# Patient Record
Sex: Male | Born: 1995 | Race: White | Hispanic: No | Smoking: Current some day smoker
Health system: Southern US, Community
[De-identification: ages and names within clinical notes are randomized; demographics above are authoritative.]

## PROBLEM LIST (undated history)

## (undated) DIAGNOSIS — Q059 Spina bifida, unspecified: Secondary | ICD-10-CM

## (undated) DIAGNOSIS — J189 Pneumonia, unspecified organism: Secondary | ICD-10-CM

## (undated) DIAGNOSIS — F419 Anxiety disorder, unspecified: Secondary | ICD-10-CM

## (undated) DIAGNOSIS — N159 Renal tubulo-interstitial disease, unspecified: Secondary | ICD-10-CM

## (undated) DIAGNOSIS — I10 Essential (primary) hypertension: Secondary | ICD-10-CM

## (undated) DIAGNOSIS — Z9889 Other specified postprocedural states: Secondary | ICD-10-CM

## (undated) DIAGNOSIS — F32A Depression, unspecified: Secondary | ICD-10-CM

## (undated) DIAGNOSIS — F329 Major depressive disorder, single episode, unspecified: Secondary | ICD-10-CM

## (undated) DIAGNOSIS — E669 Obesity, unspecified: Secondary | ICD-10-CM

## (undated) DIAGNOSIS — G4730 Sleep apnea, unspecified: Secondary | ICD-10-CM

## (undated) DIAGNOSIS — R519 Headache, unspecified: Secondary | ICD-10-CM

## (undated) DIAGNOSIS — R51 Headache: Secondary | ICD-10-CM

## (undated) DIAGNOSIS — R112 Nausea with vomiting, unspecified: Secondary | ICD-10-CM

## (undated) HISTORY — PX: BLADDER SURGERY: SHX569

## (undated) HISTORY — PX: WISDOM TOOTH EXTRACTION: SHX21

## (undated) HISTORY — PX: PORT-A-CATH REMOVAL: SHX5289

## (undated) HISTORY — DX: Obesity, unspecified: E66.9

## (undated) HISTORY — PX: PORT A CATH REVISION: SHX6033

## (undated) HISTORY — DX: Major depressive disorder, single episode, unspecified: F32.9

## (undated) HISTORY — PX: STOMACH SURGERY: SHX791

## (undated) HISTORY — PX: TONSILLECTOMY: SUR1361

## (undated) HISTORY — PX: ADENOIDECTOMY: SHX5191

## (undated) HISTORY — PX: BACK SURGERY: SHX140

## (undated) HISTORY — DX: Depression, unspecified: F32.A

## (undated) HISTORY — PX: FRACTURE SURGERY: SHX138

---
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
## 2015-01-28 DIAGNOSIS — E559 Vitamin D deficiency, unspecified: Secondary | ICD-10-CM | POA: Insufficient documentation

## 2015-05-21 DIAGNOSIS — G4710 Hypersomnia, unspecified: Secondary | ICD-10-CM | POA: Insufficient documentation

## 2015-05-21 DIAGNOSIS — R0683 Snoring: Secondary | ICD-10-CM | POA: Insufficient documentation

## 2016-04-05 DIAGNOSIS — J069 Acute upper respiratory infection, unspecified: Secondary | ICD-10-CM | POA: Diagnosis not present

## 2016-05-13 DIAGNOSIS — N390 Urinary tract infection, site not specified: Secondary | ICD-10-CM | POA: Diagnosis not present

## 2016-05-13 DIAGNOSIS — B372 Candidiasis of skin and nail: Secondary | ICD-10-CM | POA: Diagnosis not present

## 2016-05-13 DIAGNOSIS — I10 Essential (primary) hypertension: Secondary | ICD-10-CM | POA: Diagnosis not present

## 2016-06-27 DIAGNOSIS — K592 Neurogenic bowel, not elsewhere classified: Secondary | ICD-10-CM | POA: Diagnosis not present

## 2016-06-27 DIAGNOSIS — Q059 Spina bifida, unspecified: Secondary | ICD-10-CM | POA: Diagnosis not present

## 2016-06-27 DIAGNOSIS — R319 Hematuria, unspecified: Secondary | ICD-10-CM | POA: Diagnosis not present

## 2016-06-27 DIAGNOSIS — N390 Urinary tract infection, site not specified: Secondary | ICD-10-CM | POA: Diagnosis not present

## 2016-06-27 DIAGNOSIS — N319 Neuromuscular dysfunction of bladder, unspecified: Secondary | ICD-10-CM | POA: Diagnosis not present

## 2017-02-02 DIAGNOSIS — I10 Essential (primary) hypertension: Secondary | ICD-10-CM | POA: Diagnosis not present

## 2017-02-02 DIAGNOSIS — I872 Venous insufficiency (chronic) (peripheral): Secondary | ICD-10-CM | POA: Diagnosis not present

## 2017-04-14 DIAGNOSIS — A084 Viral intestinal infection, unspecified: Secondary | ICD-10-CM | POA: Diagnosis not present

## 2017-04-19 ENCOUNTER — Emergency Department (HOSPITAL_COMMUNITY): Payer: 59

## 2017-04-19 ENCOUNTER — Encounter (HOSPITAL_COMMUNITY): Payer: Self-pay | Admitting: Emergency Medicine

## 2017-04-19 ENCOUNTER — Other Ambulatory Visit: Payer: Self-pay | Admitting: Family Medicine

## 2017-04-19 ENCOUNTER — Ambulatory Visit
Admission: RE | Admit: 2017-04-19 | Discharge: 2017-04-19 | Disposition: A | Discharge status: Home / Self Care | Payer: 59 | Source: Ambulatory Visit | Attending: Family Medicine | Admitting: Family Medicine

## 2017-04-19 ENCOUNTER — Inpatient Hospital Stay (HOSPITAL_COMMUNITY)
Admission: EM | Admit: 2017-04-19 | Discharge: 2017-04-24 | DRG: 580 | Disposition: A | Discharge status: Home / Self Care | Payer: 59 | Attending: Internal Medicine | Admitting: Internal Medicine

## 2017-04-19 DIAGNOSIS — R109 Unspecified abdominal pain: Secondary | ICD-10-CM | POA: Diagnosis not present

## 2017-04-19 DIAGNOSIS — L02211 Cutaneous abscess of abdominal wall: Secondary | ICD-10-CM | POA: Diagnosis not present

## 2017-04-19 DIAGNOSIS — L03311 Cellulitis of abdominal wall: Secondary | ICD-10-CM | POA: Diagnosis not present

## 2017-04-19 DIAGNOSIS — Z885 Allergy status to narcotic agent status: Secondary | ICD-10-CM

## 2017-04-19 DIAGNOSIS — Z888 Allergy status to other drugs, medicaments and biological substances status: Secondary | ICD-10-CM

## 2017-04-19 DIAGNOSIS — Z79899 Other long term (current) drug therapy: Secondary | ICD-10-CM

## 2017-04-19 DIAGNOSIS — Z91040 Latex allergy status: Secondary | ICD-10-CM

## 2017-04-19 DIAGNOSIS — N319 Neuromuscular dysfunction of bladder, unspecified: Secondary | ICD-10-CM | POA: Diagnosis present

## 2017-04-19 DIAGNOSIS — Z8249 Family history of ischemic heart disease and other diseases of the circulatory system: Secondary | ICD-10-CM

## 2017-04-19 DIAGNOSIS — L0390 Cellulitis, unspecified: Secondary | ICD-10-CM

## 2017-04-19 DIAGNOSIS — Q059 Spina bifida, unspecified: Secondary | ICD-10-CM

## 2017-04-19 DIAGNOSIS — Z881 Allergy status to other antibiotic agents status: Secondary | ICD-10-CM

## 2017-04-19 DIAGNOSIS — Z6841 Body Mass Index (BMI) 40.0 and over, adult: Secondary | ICD-10-CM

## 2017-04-19 DIAGNOSIS — B9620 Unspecified Escherichia coli [E. coli] as the cause of diseases classified elsewhere: Secondary | ICD-10-CM | POA: Diagnosis present

## 2017-04-19 DIAGNOSIS — Z91018 Allergy to other foods: Secondary | ICD-10-CM

## 2017-04-19 DIAGNOSIS — Z221 Carrier of other intestinal infectious diseases: Secondary | ICD-10-CM

## 2017-04-19 DIAGNOSIS — D72829 Elevated white blood cell count, unspecified: Secondary | ICD-10-CM

## 2017-04-19 DIAGNOSIS — L03319 Cellulitis of trunk, unspecified: Secondary | ICD-10-CM

## 2017-04-19 DIAGNOSIS — I10 Essential (primary) hypertension: Secondary | ICD-10-CM | POA: Diagnosis present

## 2017-04-19 DIAGNOSIS — L02219 Cutaneous abscess of trunk, unspecified: Secondary | ICD-10-CM | POA: Diagnosis present

## 2017-04-19 DIAGNOSIS — Z1624 Resistance to multiple antibiotics: Secondary | ICD-10-CM | POA: Diagnosis present

## 2017-04-19 DIAGNOSIS — E876 Hypokalemia: Secondary | ICD-10-CM | POA: Diagnosis present

## 2017-04-19 HISTORY — DX: Essential (primary) hypertension: I10

## 2017-04-19 LAB — CBC WITH DIFFERENTIAL/PLATELET
BASOS ABS: 0 10*3/uL (ref 0.0–0.1)
Basophils Relative: 0 %
EOS ABS: 0.1 10*3/uL (ref 0.0–0.7)
Eosinophils Relative: 1 %
HCT: 47 % (ref 39.0–52.0)
HEMOGLOBIN: 15.5 g/dL (ref 13.0–17.0)
LYMPHS ABS: 1.8 10*3/uL (ref 0.7–4.0)
LYMPHS PCT: 13 %
MCH: 27.1 pg (ref 26.0–34.0)
MCHC: 33 g/dL (ref 30.0–36.0)
MCV: 82.3 fL (ref 78.0–100.0)
Monocytes Absolute: 1.5 10*3/uL — ABNORMAL HIGH (ref 0.1–1.0)
Monocytes Relative: 10 %
NEUTROS PCT: 76 %
Neutro Abs: 11.3 10*3/uL — ABNORMAL HIGH (ref 1.7–7.7)
PLATELETS: 293 10*3/uL (ref 150–400)
RBC: 5.71 MIL/uL (ref 4.22–5.81)
RDW: 14.1 % (ref 11.5–15.5)
WBC: 14.6 10*3/uL — AB (ref 4.0–10.5)

## 2017-04-19 LAB — URINALYSIS, ROUTINE W REFLEX MICROSCOPIC
Bilirubin Urine: NEGATIVE
GLUCOSE, UA: NEGATIVE mg/dL
KETONES UR: NEGATIVE mg/dL
Nitrite: NEGATIVE
PH: 6 (ref 5.0–8.0)
Protein, ur: NEGATIVE mg/dL
SPECIFIC GRAVITY, URINE: 1.014 (ref 1.005–1.030)

## 2017-04-19 LAB — COMPREHENSIVE METABOLIC PANEL
ALT: 75 U/L — ABNORMAL HIGH (ref 17–63)
AST: 73 U/L — ABNORMAL HIGH (ref 15–41)
Albumin: 3.8 g/dL (ref 3.5–5.0)
Alkaline Phosphatase: 89 U/L (ref 38–126)
Anion gap: 11 (ref 5–15)
BILIRUBIN TOTAL: 2.3 mg/dL — AB (ref 0.3–1.2)
BUN: 11 mg/dL (ref 6–20)
CHLORIDE: 99 mmol/L — AB (ref 101–111)
CO2: 28 mmol/L (ref 22–32)
Calcium: 9.2 mg/dL (ref 8.9–10.3)
Creatinine, Ser: 1.09 mg/dL (ref 0.61–1.24)
Glucose, Bld: 93 mg/dL (ref 65–99)
POTASSIUM: 3.2 mmol/L — AB (ref 3.5–5.1)
Sodium: 138 mmol/L (ref 135–145)
TOTAL PROTEIN: 8.2 g/dL — AB (ref 6.5–8.1)

## 2017-04-19 LAB — I-STAT CG4 LACTIC ACID, ED: LACTIC ACID, VENOUS: 0.82 mmol/L (ref 0.5–1.9)

## 2017-04-19 MED ORDER — IOPAMIDOL (ISOVUE-300) INJECTION 61%
INTRAVENOUS | Status: AC
  Administered 2017-04-19: 100 mL
  Filled 2017-04-19: qty 100

## 2017-04-19 NOTE — ED Notes (Signed)
Patient transported to CT 

## 2017-04-19 NOTE — ED Triage Notes (Signed)
Patient has port in abd so could flush colon out due to spina bifida that had removed 5 years ago. Patient reports that he was [facility] which showed fluid so told to come to ED for CT scan.  Patient was started on amoxicillin. Patient does have erythema on right lateral abd.

## 2017-04-19 NOTE — ED Notes (Signed)
Failed attempt to collect labs. RN notified °

## 2017-04-19 NOTE — ED Provider Notes (Signed)
[facility]-EMERGENCY DEPT Provider Note   CSN: [id number] Arrival date & time: 04/19/17  1632     History   Chief Complaint Chief Complaint  Patient presents with  . sent for CT scan    HPI Thomas [name] is a 22 y.o. male.  22yo M w/ PMH including spina bifida, HTN, self-catherization who p/w abdominal pain.  6 days ago he began having vomiting and diarrhea that he thought were related to a stomach bug.  The following day he saw his PCP and was given nausea and diarrhea medications.  His symptoms resolved a day later but 4 days ago he began having right lower abdominal pain.  He initially thought that it was related to his recent illness but over the past few days he has noted an area of redness near the site of his previous port and he has had worsening swelling, pain, and warmth.  He was evaluated in clinic today and US showed fluid, was sent here for CT scan. He denies any associated fevers.  He has to self cath daily and has not noted any changes in urination.  He has not had any recent vomiting or diarrhea.  His port was removed several years ago and he has not had any problems since then.   The history is provided by the patient.    Past Medical History:  Diagnosis Date  . Hypertension   . Spina bifida (HCC)     There are no active problems to display for this patient.   Past Surgical History:  Procedure Laterality Date  . ADENOIDECTOMY    . BACK SURGERY    . BLADDER SURGERY    . FOOT SURGERY    . FRACTURE SURGERY    . PORT-A-CATH REMOVAL    . STOMACH SURGERY    . TONSILLECTOMY         Home Medications    Prior to Admission medications   Medication Sig Start Date End Date Taking? Authorizing Provider  atenolol (TENORMIN) 100 MG tablet Take 100 mg by mouth daily. 02/02/17  Yes [provider]  hydrochlorothiazide (HYDRODIURIL) 25 MG tablet Take 25 mg by mouth every morning. 02/02/17  Yes [provider]  ibuprofen  (ADVIL,MOTRIN) 200 MG tablet Take 400 mg by mouth every 6 (six) hours as needed. Patient used this medication for his headache.   Yes [provider]    Family History Family History  Problem Relation Age of Onset  . Asthma Other   . Cancer Other   . COPD Other   . Hyperlipidemia Other   . Hypertension Other   . Stroke Other     Social History Social History   Tobacco Use  . Smoking status: Never Smoker  . Smokeless tobacco: Never Used  Substance Use Topics  . Alcohol use: No  . Drug use: No     Allergies   Codeine; Latex; Lorazepam; Sulfa antibiotics; Vancomycin; Food; and Ciprofloxacin   Review of Systems Review of Systems All other systems reviewed and are negative except that which was mentioned in HPI   Physical Exam Updated Vital Signs BP (!) 159/102 (BP Location: Right Arm)   Pulse 82   Temp 98.9 F (37.2 C) (Oral)   Resp 20   SpO2 99%   Physical Exam  Constitutional: He is oriented to person, place, and time. He appears well-developed and well-nourished. No distress.  HENT:  Head: Normocephalic and atraumatic.  Moist mucous membranes  Eyes: Conjunctivae are  normal.  Neck: Neck supple.  Cardiovascular: Normal rate, regular rhythm and normal heart sounds.  No murmur heard. Pulmonary/Chest: Effort normal and breath sounds normal.  Abdominal: Soft. Bowel sounds are normal. He exhibits no distension. There is tenderness. There is no rebound and no guarding.  Scar on R lower abdomen that is erythematous, fluctuant, and tender to palpation with large surrounding area of erythema and induration; no other areas of tenderness outside erythema  Musculoskeletal: He exhibits no edema.  Neurological: He is alert and oriented to person, place, and time.  Fluent speech  Skin: Skin is warm and dry.  Psychiatric: He has a normal mood and affect. Judgment normal.  Nursing note and vitals reviewed.    ED Treatments / Results  Labs (all labs ordered are  listed, but only abnormal results are displayed) Labs Reviewed  COMPREHENSIVE METABOLIC PANEL - Abnormal; Notable for the following components:      Result Value   Potassium 3.2 (*)    Chloride 99 (*)    Total Protein 8.2 (*)    AST 73 (*)    ALT 75 (*)    Total Bilirubin 2.3 (*)    All other components within normal limits  CBC WITH DIFFERENTIAL/PLATELET - Abnormal; Notable for the following components:   WBC 14.6 (*)    Neutro Abs 11.3 (*)    Monocytes Absolute 1.5 (*)    All other components within normal limits  URINALYSIS, ROUTINE W REFLEX MICROSCOPIC - Abnormal; Notable for the following components:   Color, Urine AMBER (*)    APPearance HAZY (*)    Hgb urine dipstick SMALL (*)    Leukocytes, UA MODERATE (*)    Bacteria, UA RARE (*)    Squamous Epithelial / LPF 0-5 (*)    All other components within normal limits  I-STAT CG4 LACTIC ACID, ED    EKG  EKG Interpretation None       Radiology Ct Abdomen Pelvis W Contrast  Result Date: 04/19/2017 CLINICAL DATA:  22 y/o M; right lateral abdomen abdominal pain, question abscess. EXAM: CT ABDOMEN AND PELVIS WITH CONTRAST TECHNIQUE: Multidetector CT imaging of the abdomen and pelvis was performed using the standard protocol following bolus administration of intravenous contrast. CONTRAST:  ISOVUE-300 IOPAMIDOL (ISOVUE-300) INJECTION 61% COMPARISON:  04/19/2017 abdominal ultrasound. FINDINGS: Lower chest: No acute abnormality. Hepatobiliary: Hepatic steatosis. No focal liver lesion identified. Increased attenuation within the dependent gallbladder probably represents sludge and/or small stones. No biliary ductal dilatation. Pancreas: Unremarkable. No pancreatic ductal dilatation or surrounding inflammatory changes. Spleen: Normal in size without focal abnormality. Adrenals/Urinary Tract: Atrophic right kidney. Normal appearance of left kidney. Normal adrenal glands. No hydronephrosis. Redundant bladder with mild wall thickening.  Stomach/Bowel: Within the right lower anterior abdominal wall there is an air-filled cavity extending to the abdominal wall musculature. At the deep end of the cavity there is a fluid collection measuring 3.5 cm that extends to the abdominal wall musculature (series 5 image 46). Within the peritoneal cavity a loop of colon is tethered to the abdominal wall musculature and may be connected by fistula to the overlying fluid collection. No obstructive or inflammatory changes of the bowel identified. Right lower quadrant bowel anastomosis without inflammatory or proximal obstruction. Vascular/Lymphatic: No significant vascular findings are present. No enlarged abdominal or pelvic lymph nodes. Reproductive: Prostate is unremarkable. Other: No ascites. Musculoskeletal: Complex lumbar and sacral dysraphism. No acute osseous abnormality identified. IMPRESSION: 1. Air and fluid-filled collection with right lower anterior abdominal wall subcutaneous  fat likely connected to a subjacent tethered loop of colon by fistula. 2. Hepatic steatosis. 3. Gallbladder sludge and/or small stones. 4. Atrophic right kidney. 5. Redundant bladder with mild wall thickening, question neurogenic bladder. 6. Complex lumbar and sacral dysraphism. Electronically Signed   By: Mitzi Hansen M.D.   On: 04/19/2017 22:15   US Abdomen Limited  Result Date: 04/19/2017 CLINICAL DATA:  Pain at site of prior port, some redness EXAM: ULTRASOUND ABDOMEN LIMITED COMPARISON:  None. FINDINGS: Ultrasound over the area in question was performed. There is some fluid within the subcutaneous soft tissues extending toward the peritoneal cavity. However much of this area is obscured by bowel gas and cannot be evaluated well by ultrasound. Therefore CT of the abdomen pelvis is recommended to assess for possible abscess which may communicate with the peritoneal cavity. IMPRESSION: There is fluid within the subcutaneous soft tissues extending deeper possibly  into the peritoneal cavity. Recommend CT of the abdomen pelvis as noted above. Electronically Signed   By: Dwyane Dee M.D.   On: 04/19/2017 12:27    Procedures Procedures (including critical care time)  Medications Ordered in ED Medications  iopamidol (ISOVUE-300) 61 % injection (100 mLs  Contrast Given 04/19/17 2144)     Initial Impression / Assessment and Plan / ED Course  I have reviewed the triage vital signs and the nursing notes.  Pertinent labs & imaging results that were available during my care of the patient were reviewed by me and considered in my medical decision making (see chart for details).     PT w/ abdominal wall cellulitis on exam ,concern for deep space fluid collection in abdominal wall. Labs show K 3.2, Cr 1.09, AST 73, ALT 75, tbili 2.3, WBC 14.6. UA with WBC and leukocytes which is expected given pt's bladder history. He denies any urinary sx and therefore I do not feel he needs treatment but I added urine culture.   CT shows air and fluid-filled collection in right lower abdominal wall subcutaneous fat looks connected to a tethered loop of colon by a fistula.  I contacted general surgery, discussed with Dr. Maisie Fus.  She will evaluate the patient in ED. I am signing pt out to Dr. Nicanor Alcon. I anticipate admission for further treatment.   Final Clinical Impressions(s) / ED Diagnoses   Final diagnoses:  None    ED Discharge Orders    None       Little, Ambrose Finland, MD 04/20/17 0009

## 2017-04-19 NOTE — Consult Note (Signed)
Reason for Consult:Abdominal wall infection Referring Physician: Dr Venancio Poisson Gladd is an 22 y.o. male.  HPI: Pt with spina bifida.  Had what sounds like a MACE procedure at [facility] as a child to help with constipation.  He had this removed a few years ago since he did not need it any more with his current bowel regimen.  He has had no problems since then.  He developed cellulitis earlier this week.  He was seen by his PCP and sent to ED for [place] to eval for abscess.  He has had a GI bug recently.  Past Medical History:  Diagnosis Date  . Hypertension   . Spina bifida [facility])     Past Surgical History:  Procedure Laterality Date  . ADENOIDECTOMY    . BACK SURGERY    . BLADDER SURGERY    . FOOT SURGERY    . FRACTURE SURGERY    . PORT-A-CATH REMOVAL    . STOMACH SURGERY    . TONSILLECTOMY      Family History  Problem Relation Age of Onset  . Asthma Other   . Cancer Other   . COPD Other   . Hyperlipidemia Other   . Hypertension Other   . Stroke Other     Social History:  reports that  has never smoked. he has never used smokeless tobacco. He reports that he does not drink alcohol or use drugs.  Allergies:  Allergies  Allergen Reactions  . Codeine Rash  . Latex Rash and Swelling    Other reaction(s): Other (See Comments) Other Reaction: mouth swells   . Lorazepam Other (See Comments)    Hallucinations Other reaction(s): Hallucinations, Other (See Comments) Other Reaction: Hallucinations   . Sulfa Antibiotics Rash  . Vancomycin     Other reaction(s): Other (See Comments) Other Reaction: Redman's syndrome  . Food     Tropical fruit  . Ciprofloxacin Nausea Only    Other reaction(s): Other (See Comments), Vomiting chills    Medications: I have reviewed the patient's current medications.  Results for orders placed or performed during the hospital encounter of 04/19/17 (from the past 48 hour(s))  Comprehensive metabolic panel     Status: Abnormal   Collection  Time: 04/19/17  6:36 PM  Result Value Ref Range   Sodium 138 135 - 145 mmol/L   Potassium 3.2 (L) 3.5 - 5.1 mmol/L   Chloride 99 (L) 101 - 111 mmol/L   CO2 28 22 - 32 mmol/L   Glucose, Bld 93 65 - 99 mg/dL   BUN 11 6 - 20 mg/dL   Creatinine, Ser 1.09 0.61 - 1.24 mg/dL   Calcium 9.2 8.9 - 10.3 mg/dL   Total Protein 8.2 (H) 6.5 - 8.1 g/dL   Albumin 3.8 3.5 - 5.0 g/dL   AST 73 (H) 15 - 41 U/L   ALT 75 (H) 17 - 63 U/L   Alkaline Phosphatase 89 38 - 126 U/L   Total Bilirubin 2.3 (H) 0.3 - 1.2 mg/dL   GFR calc non Af Amer >60 >60 mL/min   GFR calc Af Amer >60 >60 mL/min    Comment: (NOTE) The eGFR has been calculated using the CKD EPI equation. This calculation has not been validated in all clinical situations. eGFR's persistently <60 mL/min signify possible Chronic Kidney Disease.    Anion gap 11 5 - 15  CBC with Differential     Status: Abnormal   Collection Time: 04/19/17  6:36 PM  Result Value  Ref Range   WBC 14.6 (H) 4.0 - 10.5 K/uL   RBC 5.71 4.22 - 5.81 MIL/uL   Hemoglobin 15.5 13.0 - 17.0 g/dL   HCT 47.0 39.0 - 52.0 %   MCV 82.3 78.0 - 100.0 fL   MCH 27.1 26.0 - 34.0 pg   MCHC 33.0 30.0 - 36.0 g/dL   RDW 14.1 11.5 - 15.5 %   Platelets 293 150 - 400 K/uL   Neutrophils Relative % 76 %   Neutro Abs 11.3 (H) 1.7 - 7.7 K/uL   Lymphocytes Relative 13 %   Lymphs Abs 1.8 0.7 - 4.0 K/uL   Monocytes Relative 10 %   Monocytes Absolute 1.5 (H) 0.1 - 1.0 K/uL   Eosinophils Relative 1 %   Eosinophils Absolute 0.1 0.0 - 0.7 K/uL   Basophils Relative 0 %   Basophils Absolute 0.0 0.0 - 0.1 K/uL  I-Stat CG4 Lactic Acid, ED     Status: None   Collection Time: 04/19/17  6:44 PM  Result Value Ref Range   Lactic Acid, Venous 0.82 0.5 - 1.9 mmol/L  Urinalysis, Routine w reflex microscopic     Status: Abnormal   Collection Time: 04/19/17  6:51 PM  Result Value Ref Range   Color, Urine AMBER (A) YELLOW    Comment: BIOCHEMICALS MAY BE AFFECTED BY COLOR   APPearance HAZY (A) CLEAR    Specific Gravity, Urine 1.014 1.005 - 1.030   pH 6.0 5.0 - 8.0   Glucose, UA NEGATIVE NEGATIVE mg/dL   Hgb urine dipstick SMALL (A) NEGATIVE   Bilirubin Urine NEGATIVE NEGATIVE   Ketones, ur NEGATIVE NEGATIVE mg/dL   Protein, ur NEGATIVE NEGATIVE mg/dL   Nitrite NEGATIVE NEGATIVE   Leukocytes, UA MODERATE (A) NEGATIVE   RBC / HPF 6-30 0 - 5 RBC/hpf   WBC, UA TOO NUMEROUS TO COUNT 0 - 5 WBC/hpf   Bacteria, UA RARE (A) NONE SEEN   Squamous Epithelial / LPF 0-5 (A) NONE SEEN   WBC Clumps PRESENT    Mucus PRESENT     Ct Abdomen Pelvis W Contrast  Result Date: 04/19/2017 CLINICAL DATA:  22 y/o M; right lateral abdomen abdominal pain, question abscess. EXAM: CT ABDOMEN AND PELVIS WITH CONTRAST TECHNIQUE: Multidetector CT imaging of the abdomen and pelvis was performed using the standard protocol following bolus administration of intravenous contrast. CONTRAST:  166m ISOVUE-300 IOPAMIDOL (ISOVUE-300) INJECTION 61% COMPARISON:  04/19/2017 abdominal ultrasound. FINDINGS: Lower chest: No acute abnormality. Hepatobiliary: Hepatic steatosis. No focal liver lesion identified. Increased attenuation within the dependent gallbladder probably represents sludge and/or small stones. No biliary ductal dilatation. Pancreas: Unremarkable. No pancreatic ductal dilatation or surrounding inflammatory changes. Spleen: Normal in size without focal abnormality. Adrenals/Urinary Tract: Atrophic right kidney. Normal appearance of left kidney. Normal adrenal glands. No hydronephrosis. Redundant bladder with mild wall thickening. Stomach/Bowel: Within the right lower anterior abdominal wall there is an air-filled cavity extending to the abdominal wall musculature. At the deep end of the cavity there is a fluid collection measuring 3.5 cm that extends to the abdominal wall musculature (series 5 image 46). Within the peritoneal cavity a loop of colon is tethered to the abdominal wall musculature and may be connected by fistula to  the overlying fluid collection. No obstructive or inflammatory changes of the bowel identified. Right lower quadrant bowel anastomosis without inflammatory or proximal obstruction. Vascular/Lymphatic: No significant vascular findings are present. No enlarged abdominal or pelvic lymph nodes. Reproductive: Prostate is unremarkable. Other: No ascites. Musculoskeletal: Complex lumbar  and sacral dysraphism. No acute osseous abnormality identified. IMPRESSION: 1. Air and fluid-filled collection with right lower anterior abdominal wall subcutaneous fat likely connected to a subjacent tethered loop of colon by fistula. 2. Hepatic steatosis. 3. Gallbladder sludge and/or small stones. 4. Atrophic right kidney. 5. Redundant bladder with mild wall thickening, question neurogenic bladder. 6. Complex lumbar and sacral dysraphism. Electronically Signed   By: Kristine Garbe M.D.   On: 04/19/2017 22:15   US Abdomen Limited  Result Date: 04/19/2017 CLINICAL DATA:  Pain at site of prior port, some redness EXAM: ULTRASOUND ABDOMEN LIMITED COMPARISON:  None. FINDINGS: Ultrasound over the area in question was performed. There is some fluid within the subcutaneous soft tissues extending toward the peritoneal cavity. However much of this area is obscured by bowel gas and cannot be evaluated well by ultrasound. Therefore CT of the abdomen pelvis is recommended to assess for possible abscess which may communicate with the peritoneal cavity. IMPRESSION: There is fluid within the subcutaneous soft tissues extending deeper possibly into the peritoneal cavity. Recommend CT of the abdomen pelvis as noted above. Electronically Signed   By: Ivar Drape M.D.   On: 04/19/2017 12:27    Review of Systems  Constitutional: Negative for chills and fever.  Eyes: Negative for blurred vision and double vision.  Cardiovascular: Negative for chest pain and palpitations.  Gastrointestinal: Positive for diarrhea. Negative for nausea and  vomiting.  Genitourinary: Negative for dysuria and urgency.  Skin: Negative for itching.  Neurological: Negative for dizziness and headaches.   Blood pressure (!) 159/102, pulse 82, temperature 98.9 F (37.2 C), temperature source Oral, resp. rate 20, SpO2 99 %. Physical Exam  Constitutional: He is oriented to person, place, and time. He appears well-developed and well-nourished.  HENT:  Head: Normocephalic and atraumatic.  Eyes: Conjunctivae and EOM are normal. Pupils are equal, round, and reactive to light.  Neck: Normal range of motion. Neck supple.  Cardiovascular: Normal rate and regular rhythm.  Respiratory: Effort normal. No respiratory distress.  GI: Soft. He exhibits no distension. There is tenderness (RLQ with associated cellulitis).  Musculoskeletal: Normal range of motion.  Neurological: He is alert and oriented to person, place, and time.    Assessment/Plan: 22 y.o. M with abdominal wall abscess. 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  Recommend IV abx for cellulitis.  NPO p MN.  Will plan on I&D tomorrow.  Could be done at bedside or in OR depending on pt preference and operating surgeon.    Kenidy Crossland C. 3/36/1224, 49:75 PM

## 2017-04-20 ENCOUNTER — Other Ambulatory Visit: Payer: Self-pay

## 2017-04-20 ENCOUNTER — Encounter (HOSPITAL_COMMUNITY): Payer: Self-pay

## 2017-04-20 DIAGNOSIS — I10 Essential (primary) hypertension: Secondary | ICD-10-CM | POA: Diagnosis not present

## 2017-04-20 DIAGNOSIS — L03319 Cellulitis of trunk, unspecified: Secondary | ICD-10-CM | POA: Diagnosis not present

## 2017-04-20 DIAGNOSIS — E876 Hypokalemia: Secondary | ICD-10-CM | POA: Diagnosis not present

## 2017-04-20 DIAGNOSIS — L02211 Cutaneous abscess of abdominal wall: Secondary | ICD-10-CM | POA: Diagnosis not present

## 2017-04-20 DIAGNOSIS — L03311 Cellulitis of abdominal wall: Secondary | ICD-10-CM | POA: Diagnosis not present

## 2017-04-20 DIAGNOSIS — R109 Unspecified abdominal pain: Secondary | ICD-10-CM | POA: Diagnosis not present

## 2017-04-20 DIAGNOSIS — N319 Neuromuscular dysfunction of bladder, unspecified: Secondary | ICD-10-CM | POA: Diagnosis not present

## 2017-04-20 DIAGNOSIS — Z888 Allergy status to other drugs, medicaments and biological substances status: Secondary | ICD-10-CM | POA: Diagnosis not present

## 2017-04-20 DIAGNOSIS — Z881 Allergy status to other antibiotic agents status: Secondary | ICD-10-CM | POA: Diagnosis not present

## 2017-04-20 DIAGNOSIS — Z91040 Latex allergy status: Secondary | ICD-10-CM | POA: Diagnosis not present

## 2017-04-20 DIAGNOSIS — D72829 Elevated white blood cell count, unspecified: Secondary | ICD-10-CM

## 2017-04-20 DIAGNOSIS — Z8249 Family history of ischemic heart disease and other diseases of the circulatory system: Secondary | ICD-10-CM | POA: Diagnosis not present

## 2017-04-20 DIAGNOSIS — Z885 Allergy status to narcotic agent status: Secondary | ICD-10-CM | POA: Diagnosis not present

## 2017-04-20 DIAGNOSIS — Z79899 Other long term (current) drug therapy: Secondary | ICD-10-CM | POA: Diagnosis not present

## 2017-04-20 DIAGNOSIS — Z91018 Allergy to other foods: Secondary | ICD-10-CM | POA: Diagnosis not present

## 2017-04-20 DIAGNOSIS — Z6841 Body Mass Index (BMI) 40.0 and over, adult: Secondary | ICD-10-CM | POA: Diagnosis not present

## 2017-04-20 DIAGNOSIS — Z1624 Resistance to multiple antibiotics: Secondary | ICD-10-CM | POA: Diagnosis present

## 2017-04-20 DIAGNOSIS — Q059 Spina bifida, unspecified: Secondary | ICD-10-CM | POA: Diagnosis not present

## 2017-04-20 DIAGNOSIS — L02219 Cutaneous abscess of trunk, unspecified: Secondary | ICD-10-CM | POA: Diagnosis not present

## 2017-04-20 DIAGNOSIS — B9620 Unspecified Escherichia coli [E. coli] as the cause of diseases classified elsewhere: Secondary | ICD-10-CM | POA: Diagnosis present

## 2017-04-20 LAB — CBC
HCT: 44.2 % (ref 39.0–52.0)
Hemoglobin: 14.5 g/dL (ref 13.0–17.0)
MCH: 27.2 pg (ref 26.0–34.0)
MCHC: 32.8 g/dL (ref 30.0–36.0)
MCV: 82.8 fL (ref 78.0–100.0)
PLATELETS: 304 10*3/uL (ref 150–400)
RBC: 5.34 MIL/uL (ref 4.22–5.81)
RDW: 14.5 % (ref 11.5–15.5)
WBC: 15.2 10*3/uL — ABNORMAL HIGH (ref 4.0–10.5)

## 2017-04-20 LAB — BASIC METABOLIC PANEL
Anion gap: 11 (ref 5–15)
BUN: 10 mg/dL (ref 6–20)
CO2: 25 mmol/L (ref 22–32)
CREATININE: 0.9 mg/dL (ref 0.61–1.24)
Calcium: 8.7 mg/dL — ABNORMAL LOW (ref 8.9–10.3)
Chloride: 101 mmol/L (ref 101–111)
Glucose, Bld: 102 mg/dL — ABNORMAL HIGH (ref 65–99)
Potassium: 3.1 mmol/L — ABNORMAL LOW (ref 3.5–5.1)
SODIUM: 137 mmol/L (ref 135–145)

## 2017-04-20 LAB — PROTIME-INR
INR: 1.09
PROTHROMBIN TIME: 14 s (ref 11.4–15.2)

## 2017-04-20 LAB — APTT: aPTT: 23 seconds — ABNORMAL LOW (ref 24–36)

## 2017-04-20 MED ORDER — ENOXAPARIN SODIUM 40 MG/0.4ML ~~LOC~~ SOLN
40.0000 mg | SUBCUTANEOUS | Status: DC
  Administered 2017-04-21 – 2017-04-22 (×2): 40 mg via SUBCUTANEOUS
  Filled 2017-04-20 (×2): qty 0.4

## 2017-04-20 MED ORDER — ONDANSETRON HCL 4 MG/2ML IJ SOLN: 4.0000 mg | Freq: Four times a day (QID) | INTRAMUSCULAR | Status: DC | PRN

## 2017-04-20 MED ORDER — ATENOLOL 50 MG PO TABS
100.0000 mg | ORAL_TABLET | Freq: Every day | ORAL | Status: DC
  Administered 2017-04-20 – 2017-04-24 (×4): 100 mg via ORAL
  Filled 2017-04-20 (×5): qty 2

## 2017-04-20 MED ORDER — HYDROCHLOROTHIAZIDE 25 MG PO TABS
25.0000 mg | ORAL_TABLET | Freq: Every morning | ORAL | Status: DC
  Administered 2017-04-20 – 2017-04-24 (×5): 25 mg via ORAL
  Filled 2017-04-20 (×5): qty 1

## 2017-04-20 MED ORDER — CEFAZOLIN SODIUM-DEXTROSE 2-4 GM/100ML-% IV SOLN
2.0000 g | Freq: Three times a day (TID) | INTRAVENOUS | Status: DC
  Administered 2017-04-20 – 2017-04-21 (×5): 2 g via INTRAVENOUS
  Filled 2017-04-20 (×6): qty 100

## 2017-04-20 MED ORDER — ACETAMINOPHEN 325 MG PO TABS: 650.0000 mg | ORAL_TABLET | Freq: Four times a day (QID) | ORAL | Status: DC | PRN

## 2017-04-20 MED ORDER — ONDANSETRON HCL 4 MG PO TABS: 4.0000 mg | ORAL_TABLET | Freq: Four times a day (QID) | ORAL | Status: DC | PRN

## 2017-04-20 MED ORDER — LIDOCAINE-EPINEPHRINE 1 %-1:100000 IJ SOLN
30.0000 mL | Freq: Once | INTRAMUSCULAR | Status: AC
  Administered 2017-04-20: 30 mL
  Filled 2017-04-20: qty 30

## 2017-04-20 MED ORDER — ACETAMINOPHEN 650 MG RE SUPP: 650.0000 mg | Freq: Four times a day (QID) | RECTAL | Status: DC | PRN

## 2017-04-20 MED ORDER — FENTANYL CITRATE (PF) 100 MCG/2ML IJ SOLN
25.0000 ug | INTRAMUSCULAR | Status: DC | PRN
  Administered 2017-04-20 (×2): 25 ug via INTRAVENOUS
  Filled 2017-04-20 (×3): qty 2

## 2017-04-20 MED ORDER — FENTANYL CITRATE (PF) 100 MCG/2ML IJ SOLN
25.0000 ug | Freq: Once | INTRAMUSCULAR | Status: AC
  Administered 2017-04-20: 23:00:00 25 ug via INTRAVENOUS

## 2017-04-20 MED ORDER — SODIUM CHLORIDE 0.9 % IV SOLN
INTRAVENOUS | Status: DC
  Administered 2017-04-20 – 2017-04-21 (×4): via INTRAVENOUS

## 2017-04-20 MED ORDER — POTASSIUM CHLORIDE 10 MEQ/100ML IV SOLN
10.0000 meq | INTRAVENOUS | Status: AC
  Administered 2017-04-20 (×2): 10 meq via INTRAVENOUS
  Filled 2017-04-20 (×2): qty 100

## 2017-04-20 NOTE — Progress Notes (Signed)
Patient approved of mother and aunt to be in room during admission process questioning
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
## 2017-04-21 DIAGNOSIS — L03319 Cellulitis of trunk, unspecified: Secondary | ICD-10-CM

## 2017-04-21 DIAGNOSIS — E876 Hypokalemia: Secondary | ICD-10-CM

## 2017-04-21 DIAGNOSIS — I10 Essential (primary) hypertension: Secondary | ICD-10-CM

## 2017-04-21 DIAGNOSIS — L02219 Cutaneous abscess of trunk, unspecified: Secondary | ICD-10-CM

## 2017-04-21 LAB — HEPATIC FUNCTION PANEL
ALK PHOS: 71 U/L (ref 38–126)
ALT: 58 U/L (ref 17–63)
AST: 39 U/L (ref 15–41)
Albumin: 3.1 g/dL — ABNORMAL LOW (ref 3.5–5.0)
BILIRUBIN DIRECT: 0.5 mg/dL (ref 0.1–0.5)
BILIRUBIN TOTAL: 1.5 mg/dL — AB (ref 0.3–1.2)
Indirect Bilirubin: 1 mg/dL — ABNORMAL HIGH (ref 0.3–0.9)
Total Protein: 6.8 g/dL (ref 6.5–8.1)

## 2017-04-21 LAB — URINE CULTURE
CULTURE: NO GROWTH
SPECIAL REQUESTS: NORMAL

## 2017-04-21 MED ORDER — PIPERACILLIN-TAZOBACTAM 3.375 G IVPB
3.3750 g | Freq: Three times a day (TID) | INTRAVENOUS | Status: DC
  Administered 2017-04-21 – 2017-04-23 (×6): 3.375 g via INTRAVENOUS
  Filled 2017-04-21 (×8): qty 50

## 2017-04-21 MED ORDER — POTASSIUM CHLORIDE CRYS ER 20 MEQ PO TBCR
40.0000 meq | EXTENDED_RELEASE_TABLET | Freq: Two times a day (BID) | ORAL | Status: AC
  Administered 2017-04-21 (×2): 40 meq via ORAL
  Filled 2017-04-21 (×2): qty 2

## 2017-04-21 MED ORDER — OXYCODONE-ACETAMINOPHEN 7.5-325 MG PO TABS
1.0000 | ORAL_TABLET | ORAL | Status: DC | PRN
  Administered 2017-04-21 (×2): 1 via ORAL
  Filled 2017-04-21 (×2): qty 1
  Filled 2017-04-21: qty 2

## 2017-04-21 MED ORDER — HYDROMORPHONE HCL 1 MG/ML IJ SOLN
1.0000 mg | INTRAMUSCULAR | Status: DC | PRN
  Administered 2017-04-21 – 2017-04-23 (×6): 1 mg via INTRAVENOUS
  Administered 2017-04-24: 11:00:00 1.5 mg via INTRAVENOUS
  Filled 2017-04-21: qty 1.5
  Filled 2017-04-21 (×4): qty 1
  Filled 2017-04-21 (×2): qty 1.5

## 2017-04-21 MED ORDER — PIPERACILLIN-TAZOBACTAM 3.375 G IVPB 30 MIN
3.3750 g | Freq: Once | INTRAVENOUS | Status: AC
  Administered 2017-04-21: 3.375 g via INTRAVENOUS
  Filled 2017-04-21: qty 50
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
## 2017-04-21 NOTE — Progress Notes (Signed)
Spoke with patient and mother at bedside. [facility]. Mother states she has done his wound care in the past and feels comfortable with dressing changes. She does not feel they will need HH support. Will discuss with team to do teaching with mother to make sure she is able. No further d/c needs identified, will continue to follow. [phone]

## 2017-04-21 NOTE — Progress Notes (Addendum)
[facility] PROGRESS NOTE    Progress Note  Thomas [name]  [id number]:[id number] DOB: 1995/11/19 DOA: 04/19/2017 PCP: Iva BoopVia, Kevin, MD     Brief Narrative:   Thomas [name] is an 22 y.o. male  male with medical history significant of spina bifida,  lipomeningocele with resultant neurogenic bladder s/p [facility] procedure at [facility], HTN, and morbid obesity; who presents with complaints of abdominal pain and swelling of the right lower quadrant over the last 4 days    Assessment/Plan:   Cellulitis and abscess of trunk Surgery was consulted to perform an I&D on  and change his antibiotics he agreed, with change of antibiotics, he has remained afebrile cultures to date is negative, we will continue IV Zosyn. He remains afebrile and leukocytosis improved can probably change to oral antibiotics in the morning and discharge home.  Neurogenic bladder  Spina bifida (HCC)  Essential hypertension No changes made to his medication.  Hypokalemia Replete orally recheck in the morning.   DVT prophylaxis: lovenox Family Communication:mother Disposition Plan/Barrier to D/C: home in am Code Status:     Code Status Orders  (From admission, onward)        Start     Ordered   04/20/17 0102  Full code  Continuous     04/20/17 0107    Code Status History    Date Active Date Inactive Code Status Order ID Comments User Context   This patient has a current code status but no historical code status.        IV Access:    Peripheral IV   Procedures and diagnostic studies:   Ct Abdomen Pelvis W Contrast  Result Date: 04/19/2017 CLINICAL DATA:  22 y/o M; right lateral abdomen abdominal pain, question abscess. EXAM: CT ABDOMEN AND PELVIS WITH CONTRAST TECHNIQUE: Multidetector CT imaging of the abdomen and pelvis was performed using the standard protocol following bolus administration of intravenous contrast. CONTRAST:  100mL ISOVUE-300 IOPAMIDOL (ISOVUE-300) INJECTION 61% COMPARISON:   04/19/2017 abdominal ultrasound. FINDINGS: Lower chest: No acute abnormality. Hepatobiliary: Hepatic steatosis. No focal liver lesion identified. Increased attenuation within the dependent gallbladder probably represents sludge and/or small stones. No biliary ductal dilatation. Pancreas: Unremarkable. No pancreatic ductal dilatation or surrounding inflammatory changes. Spleen: Normal in size without focal abnormality. Adrenals/Urinary Tract: Atrophic right kidney. Normal appearance of left kidney. Normal adrenal glands. No hydronephrosis. Redundant bladder with mild wall thickening. Stomach/Bowel: Within the right lower anterior abdominal wall there is an air-filled cavity extending to the abdominal wall musculature. At the deep end of the cavity there is a fluid collection measuring 3.5 cm that extends to the abdominal wall musculature (series 5 image 46). Within the peritoneal cavity a loop of colon is tethered to the abdominal wall musculature and may be connected by fistula to the overlying fluid collection. No obstructive or inflammatory changes of the bowel identified. Right lower quadrant bowel anastomosis without inflammatory or proximal obstruction. Vascular/Lymphatic: No significant vascular findings are present. No enlarged abdominal or pelvic lymph nodes. Reproductive: Prostate is unremarkable. Other: No ascites. Musculoskeletal: Complex lumbar and sacral dysraphism. No acute osseous abnormality identified. IMPRESSION: 1. Air and fluid-filled collection with right lower anterior abdominal wall subcutaneous fat likely connected to a subjacent tethered loop of colon by fistula. 2. Hepatic steatosis. 3. Gallbladder sludge and/or small stones. 4. Atrophic right kidney. 5. Redundant bladder with mild wall thickening, question neurogenic bladder. 6. Complex lumbar and sacral dysraphism. Electronically Signed   By: Mitzi HansenLance  Furusawa-Stratton M.D.   On: 04/19/2017  22:15   US Abdomen Limited  Result Date:  04/19/2017 CLINICAL DATA:  Pain at site of prior port, some redness EXAM: ULTRASOUND ABDOMEN LIMITED COMPARISON:  None. FINDINGS: Ultrasound over the area in question was performed. There is some fluid within the subcutaneous soft tissues extending toward the peritoneal cavity. However much of this area is obscured by bowel gas and cannot be evaluated well by ultrasound. Therefore CT of the abdomen pelvis is recommended to assess for possible abscess which may communicate with the peritoneal cavity. IMPRESSION: There is fluid within the subcutaneous soft tissues extending deeper possibly into the peritoneal cavity. Recommend CT of the abdomen pelvis as noted above. Electronically Signed   By: Dwyane Dee M.D.   On: 04/19/2017 12:27     Medical Consultants:    None.  Anti-Infectives:   IV Zosyn  Subjective:    Thomas [name] no complaints feels great.  Objective:    Vitals:   04/20/17 0900 04/20/17 2108 04/21/17 0538 04/21/17 1003  BP: (!) 153/83 (!) 141/88 118/71 (!) 148/74  Pulse: 78 74 65 70  Resp: 18 16 17    Temp: 98.5 F (36.9 C) 98.3 F (36.8 C) 98.6 F (37 C)   TempSrc: Oral Oral Oral   SpO2: 95% 99% 98%   Weight:      Height:        Intake/Output Summary (Last 24 hours) at 04/21/2017 1209 Last data filed at 04/21/2017 0917 Gross per 24 hour  Intake 2102.5 ml  Output -  Net 2102.5 ml   Filed Weights   04/20/17 0600  Weight: (!) 163.3 kg (360 lb)    Exam: General exam: In no acute distress. Respiratory system: Good air movement and clear to auscultation. Cardiovascular system: S1 & S2 heard, RRR. No JVD,. Gastrointestinal system: Abdomen is nondistended, soft and nontender.  Extremities: No pedal edema. Skin: No rashes, lesions or ulcers Psychiatry: Judgement and insight appear normal. Mood & affect appropriate.    Data Reviewed:    Labs: Basic Metabolic Panel: Recent Labs  Lab 04/19/17 1836 04/20/17 0620  NA 138 137  K 3.2* 3.1*  CL 99* 101   CO2 28 25  GLUCOSE 93 102*  BUN 11 10  CREATININE 1.09 0.90  CALCIUM 9.2 8.7*   GFR Estimated Creatinine Clearance: 202.9 mL/min (by C-G formula based on SCr of 0.9 mg/dL). Liver Function Tests: Recent Labs  Lab 04/19/17 1836 04/20/17 0620  AST 73* 39  ALT 75* 58  ALKPHOS 89 71  BILITOT 2.3* 1.5*  PROT 8.2* 6.8  ALBUMIN 3.8 3.1*   No results for input(s): LIPASE, AMYLASE in the last 168 hours. No results for input(s): AMMONIA in the last 168 hours. Coagulation profile Recent Labs  Lab 04/20/17 0620  INR 1.09    CBC: Recent Labs  Lab 04/19/17 1836 04/20/17 0620  WBC 14.6* 15.2*  NEUTROABS 11.3*  --   HGB 15.5 14.5  HCT 47.0 44.2  MCV 82.3 82.8  PLT 293 304   Cardiac Enzymes: No results for input(s): CKTOTAL, CKMB, CKMBINDEX, TROPONINI in the last 168 hours. BNP (last 3 results) No results for input(s): PROBNP in the last 8760 hours. CBG: No results for input(s): GLUCAP in the last 168 hours. D-Dimer: No results for input(s): DDIMER in the last 72 hours. Hgb A1c: No results for input(s): HGBA1C in the last 72 hours. Lipid Profile: No results for input(s): CHOL, HDL, LDLCALC, TRIG, CHOLHDL, LDLDIRECT in the last 72 hours. Thyroid function studies: No results  for input(s): TSH, T4TOTAL, T3FREE, THYROIDAB in the last 72 hours.  Invalid input(s): FREET3 Anemia work up: No results for input(s): VITAMINB12, FOLATE, FERRITIN, TIBC, IRON, RETICCTPCT in the last 72 hours. Sepsis Labs: Recent Labs  Lab 04/19/17 1836 04/19/17 1844 04/20/17 0620  WBC 14.6*  --  15.2*  LATICACIDVEN  --  0.82  --    Microbiology Recent Results (from the past 240 hour(s))  Urine culture     Status: None   Collection Time: 04/20/17 12:41 AM  Result Value Ref Range Status   Specimen Description URINE, CATHETERIZED  Final   Special Requests Normal  Final   Culture   Final    NO GROWTH Performed at [facility], [address] [place], [place] [address]    Report  Status 04/21/2017 FINAL  Final  Culture, blood (routine x 2)     Status: None (Preliminary result)   Collection Time: 04/20/17  1:50 AM  Result Value Ref Range Status   Specimen Description BLOOD RIGHT ANTECUBITAL  Final   Special Requests   Final    BOTTLES DRAWN AEROBIC AND ANAEROBIC Blood Culture adequate volume   Culture   Final    NO GROWTH 1 DAY Performed at [facility], [address], [place], [place] [address]    Report Status PENDING  Incomplete  Culture, blood (routine x 2)     Status: None (Preliminary result)   Collection Time: 04/20/17  1:55 AM  Result Value Ref Range Status   Specimen Description BLOOD LEFT HAND  Final   Special Requests   Final    BOTTLES DRAWN AEROBIC AND ANAEROBIC Blood Culture results may not be optimal due to an excessive volume of blood received in culture bottles   Culture   Final    NO GROWTH 1 DAY Performed at [facility], [address], [place], [place] [address]    Report Status PENDING  Incomplete  Aerobic Culture (superficial specimen)     Status: None (Preliminary result)   Collection Time: 04/20/17 11:00 AM  Result Value Ref Range Status   Specimen Description ABDOMEN  Final   Special Requests NONE  Final   Gram Stain   Final    MODERATE WBC PRESENT, PREDOMINANTLY PMN MODERATE GRAM NEGATIVE RODS Performed at [facility], [address] [place], [place] [address]    Culture ABUNDANT GRAM NEGATIVE RODS  Final   Report Status PENDING  Incomplete     Medications:   . atenolol  100 mg Oral Daily  . enoxaparin (LOVENOX) injection  40 mg Subcutaneous Q24H  . hydrochlorothiazide  25 mg Oral q morning - 10a   Continuous Infusions: . sodium chloride 50 mL/hr at 04/21/17 1003  . piperacillin-tazobactam (ZOSYN)  IV       LOS: 1 day   Marinda Elk  [facility] Pager [phone]  *Please refer to [email or web address], password [email or web address] to get updated schedule on who will round on this patient, as hospitalists  switch teams weekly. If 7PM-7AM, please contact night-coverage at [email or web address], password [email or web address] for any overnight needs.  04/21/2017, 12:09 PM

## 2017-04-21 NOTE — Progress Notes (Signed)
Pharmacy Antibiotic Note  Thomas [name] is a 22 y.o. male admitted on 04/19/2017 with cellulitis and abscess of trunk.  Pharmacy has been consulted for Zosyn dosing.  Plan:  Zosyn 3.375 g IV given once over 30 minutes, then every 8 hrs by 4-hr infusion  Pharmacy will sign off and follow peripherally for renal adjustment and culture results  Height: 5\' 11"  (180.3 cm) Weight: (!) 360 lb (163.3 kg) IBW/kg (Calculated) : 75.3  Temp (24hrs), Avg:98.5 F (36.9 C), Min:98.3 F (36.8 C), Max:98.6 F (37 C)  Recent Labs  Lab 04/19/17 1836 04/19/17 1844 04/20/17 0620  WBC 14.6*  --  15.2*  CREATININE 1.09  --  0.90  LATICACIDVEN  --  0.82  --     Estimated Creatinine Clearance: 202.9 mL/min (by C-G formula based on SCr of 0.9 mg/dL).    Allergies  Allergen Reactions  . Codeine Rash  . Latex Rash and Swelling    Other reaction(s): Other (See Comments) Other Reaction: mouth swells   . Lorazepam Other (See Comments)    Hallucinations Other reaction(s): Hallucinations, Other (See Comments) Other Reaction: Hallucinations   . Sulfa Antibiotics Rash  . Vancomycin     Other reaction(s): Other (See Comments) Other Reaction: Redman's syndrome  . Food     Tropical fruit  . Ciprofloxacin Nausea Only    Other reaction(s): Other (See Comments), Vomiting chills     Thank you for allowing pharmacy to be a part of this patient's care.  Beverly Suriano A 04/21/2017 8:15 AM

## 2017-04-21 NOTE — Discharge Instructions (Signed)
WOUND CARE: °- dressing to be changed twice daily °- supplies: sterile saline, kerlix, scissors, ABD pads, tape  °- remove dressing and all packing carefully, moistening with sterile saline as needed to avoid packing/internal dressing sticking to the wound. °- clean edges of skin around the wound with water/gauze, making sure there is no tape debris or leakage left on skin that could cause skin irritation or breakdown. °- dampen and clean kerlix with sterile saline and pack wound from wound base to skin level, making sure to take note of any possible areas of wound tracking, tunneling and packing appropriately. Wound can be packed loosely. Trim kerlix to size if a whole kerlix is not required. °- cover wound with a dry ABD pad and secure with tape.  °- write the date/time on the dry dressing/tape to better track when the last dressing change occurred. °- change dressing as needed if leakage occurs, wound gets contaminated, or patient requests to shower. °- patient may shower daily with wound open and following the shower the wound should be dried and a clean dressing placed.  ° °

## 2017-04-22 DIAGNOSIS — L02211 Cutaneous abscess of abdominal wall: Secondary | ICD-10-CM

## 2017-04-22 DIAGNOSIS — Z6841 Body Mass Index (BMI) 40.0 and over, adult: Secondary | ICD-10-CM

## 2017-04-22 LAB — AEROBIC CULTURE W GRAM STAIN (SUPERFICIAL SPECIMEN)

## 2017-04-22 LAB — BASIC METABOLIC PANEL
Anion gap: 7 (ref 5–15)
BUN: 11 mg/dL (ref 6–20)
CALCIUM: 8.6 mg/dL — AB (ref 8.9–10.3)
CO2: 28 mmol/L (ref 22–32)
CREATININE: 0.95 mg/dL (ref 0.61–1.24)
Chloride: 103 mmol/L (ref 101–111)
GFR calc non Af Amer: >60 mL/min (ref >60)
Glucose, Bld: 103 mg/dL — ABNORMAL HIGH (ref 65–99)
Potassium: 3.7 mmol/L (ref 3.5–5.1)
SODIUM: 138 mmol/L (ref 135–145)

## 2017-04-22 LAB — CBC
HEMATOCRIT: 44.6 % (ref 39.0–52.0)
Hemoglobin: 14.3 g/dL (ref 13.0–17.0)
MCH: 27.1 pg (ref 26.0–34.0)
MCHC: 32.1 g/dL (ref 30.0–36.0)
MCV: 84.6 fL (ref 78.0–100.0)
PLATELETS: 330 10*3/uL (ref 150–400)
RBC: 5.27 MIL/uL (ref 4.22–5.81)
RDW: 14.4 % (ref 11.5–15.5)
WBC: 10.1 10*3/uL (ref 4.0–10.5)

## 2017-04-22 LAB — AEROBIC CULTURE  (SUPERFICIAL SPECIMEN)

## 2017-04-22 MED ORDER — METHOCARBAMOL 1000 MG/10ML IJ SOLN
1000.0000 mg | Freq: Four times a day (QID) | INTRAMUSCULAR | Status: DC | PRN
  Filled 2017-04-22: qty 10

## 2017-04-22 MED ORDER — METHOCARBAMOL 500 MG PO TABS
1000.0000 mg | ORAL_TABLET | Freq: Four times a day (QID) | ORAL | Status: DC | PRN
  Administered 2017-04-24: 09:00:00 1000 mg via ORAL
  Filled 2017-04-22: qty 2

## 2017-04-22 MED ORDER — GABAPENTIN 300 MG PO CAPS
300.0000 mg | ORAL_CAPSULE | Freq: Every day | ORAL | Status: DC
  Administered 2017-04-22 – 2017-04-23 (×2): 300 mg via ORAL
  Filled 2017-04-22 (×2): qty 1

## 2017-04-22 MED ORDER — ACETAMINOPHEN 500 MG PO TABS
1000.0000 mg | ORAL_TABLET | Freq: Three times a day (TID) | ORAL | Status: DC
  Administered 2017-04-22 – 2017-04-24 (×7): 1000 mg via ORAL
  Filled 2017-04-22 (×7): qty 2

## 2017-04-22 MED ORDER — OXYCODONE HCL 5 MG PO TABS
5.0000 mg | ORAL_TABLET | ORAL | Status: DC | PRN
  Administered 2017-04-22 – 2017-04-24 (×5): 10 mg via ORAL
  Filled 2017-04-22 (×2): qty 2
  Filled 2017-04-22: qty 1
  Filled 2017-04-22 (×3): qty 2

## 2017-04-22 MED ORDER — ENOXAPARIN SODIUM 80 MG/0.8ML ~~LOC~~ SOLN
0.5000 mg/kg | SUBCUTANEOUS | Status: DC
  Administered 2017-04-23 – 2017-04-24 (×2): 80 mg via SUBCUTANEOUS
  Filled 2017-04-22 (×2): qty 0.8

## 2017-04-22 NOTE — Progress Notes (Signed)
[facility] PROGRESS NOTE    Progress Note  Monique Gift Sokol  UEA:[id number] DOB: 07-20-1995 DOA: 04/19/2017 PCP: Iva Boop, MD     Brief Narrative:   Thomas [name] is an 22 y.o. male  male with medical history significant of spina bifida,  lipomeningocele with resultant neurogenic bladder s/p ACE procedure at [facility], HTN, and morbid obesity; who presents with complaints of abdominal pain and swelling of the right lower quadrant over the last 4 days    Assessment/Plan:   Cellulitis and abscess of trunk Surgery was consulted to perform an I&D on IV zosyn culture grew GNR. Fever resolved, no leucocytosis. Will await sensitivities ans speciation.  Neurogenic bladder  Spina bifida (HCC)  Essential hypertension No changes made to his medication.  Hypokalemia Resolved.   DVT prophylaxis: lovenox Family Communication:mother Disposition Plan/Barrier to D/C: unable to determine Code Status:     Code Status Orders  (From admission, onward)        Start     Ordered   04/20/17 0102  Full code  Continuous     04/20/17 0107    Code Status History    Date Active Date Inactive Code Status Order ID Comments User Context   This patient has a current code status but no historical code status.        IV Access:    Peripheral IV   Procedures and diagnostic studies:   No results found.   Medical Consultants:    None.  Anti-Infectives:   IV Zosyn  Subjective:    Thomas [name] no complaints feels great.  Objective:    Vitals:   04/21/17 1003 04/21/17 1319 04/21/17 2150 04/22/17 0458  BP: (!) 148/74 (!) 122/56 134/69 123/68  Pulse: 70 (!) 54 (!) 56 (!) 54  Resp:  16 16 16   Temp:  97.6 F (36.4 C) 99.2 F (37.3 C) 98.7 F (37.1 C)  TempSrc:  Oral Oral Oral  SpO2:  97% 100% 97%  Weight:      Height:        Intake/Output Summary (Last 24 hours) at 04/22/2017 1009 Last data filed at 04/22/2017 0600 Gross per 24 hour  Intake 1675  ml  Output -  Net 1675 ml   Filed Weights   04/20/17 0600  Weight: (!) 163.3 kg (360 lb)    Exam: General exam: In no acute distress. Respiratory system: Good air movement and clear to auscultation. Cardiovascular system: S1 & S2 heard, RRR. No JVD,. Gastrointestinal system: Abdomen is nondistended, soft and nontender.  Extremities: No pedal edema. Skin: No rashes, lesions or ulcers Psychiatry: Judgement and insight appear normal. Mood & affect appropriate.    Data Reviewed:    Labs: Basic Metabolic Panel: Recent Labs  Lab 04/19/17 1836 04/20/17 0620 04/22/17 0632  NA 138 137 138  K 3.2* 3.1* 3.7  CL 99* 101 103  CO2 28 25 28   GLUCOSE 93 102* 103*  BUN 11 10 11   CREATININE 1.09 0.90 0.95  CALCIUM 9.2 8.7* 8.6*   GFR Estimated Creatinine Clearance: 192.2 mL/min (by C-G formula based on SCr of 0.95 mg/dL). Liver Function Tests: Recent Labs  Lab 04/19/17 1836 04/20/17 0620  AST 73* 39  ALT 75* 58  ALKPHOS 89 71  BILITOT 2.3* 1.5*  PROT 8.2* 6.8  ALBUMIN 3.8 3.1*   No results for input(s): LIPASE, AMYLASE in the last 168 hours. No results for input(s): AMMONIA in the last 168 hours. Coagulation profile Recent Labs  Lab  04/20/17 0620  INR 1.09    CBC: Recent Labs  Lab 04/19/17 1836 04/20/17 0620 04/22/17 0632  WBC 14.6* 15.2* 10.1  NEUTROABS 11.3*  --   --   HGB 15.5 14.5 14.3  HCT 47.0 44.2 44.6  MCV 82.3 82.8 84.6  PLT 293 304 330   Cardiac Enzymes: No results for input(s): CKTOTAL, CKMB, CKMBINDEX, TROPONINI in the last 168 hours. BNP (last 3 results) No results for input(s): PROBNP in the last 8760 hours. CBG: No results for input(s): GLUCAP in the last 168 hours. D-Dimer: No results for input(s): DDIMER in the last 72 hours. Hgb A1c: No results for input(s): HGBA1C in the last 72 hours. Lipid Profile: No results for input(s): CHOL, HDL, LDLCALC, TRIG, CHOLHDL, LDLDIRECT in the last 72 hours. Thyroid function studies: No results  for input(s): TSH, T4TOTAL, T3FREE, THYROIDAB in the last 72 hours.  Invalid input(s): FREET3 Anemia work up: No results for input(s): VITAMINB12, FOLATE, FERRITIN, TIBC, IRON, RETICCTPCT in the last 72 hours. Sepsis Labs: Recent Labs  Lab 04/19/17 1836 04/19/17 1844 04/20/17 0620 04/22/17 0632  WBC 14.6*  --  15.2* 10.1  LATICACIDVEN  --  0.82  --   --    Microbiology Recent Results (from the past 240 hour(s))  Urine culture     Status: None   Collection Time: 04/20/17 12:41 AM  Result Value Ref Range Status   Specimen Description URINE, CATHETERIZED  Final   Special Requests Normal  Final   Culture   Final    NO GROWTH Performed at [facility], [address] [place], [place] [address]    Report Status 04/21/2017 FINAL  Final  Culture, blood (routine x 2)     Status: None (Preliminary result)   Collection Time: 04/20/17  1:50 AM  Result Value Ref Range Status   Specimen Description BLOOD RIGHT ANTECUBITAL  Final   Special Requests   Final    BOTTLES DRAWN AEROBIC AND ANAEROBIC Blood Culture adequate volume   Culture   Final    NO GROWTH 1 DAY Performed at [facility], [address] [place], [place] [address]    Report Status PENDING  Incomplete  Culture, blood (routine x 2)     Status: None (Preliminary result)   Collection Time: 04/20/17  1:55 AM  Result Value Ref Range Status   Specimen Description BLOOD LEFT HAND  Final   Special Requests   Final    BOTTLES DRAWN AEROBIC AND ANAEROBIC Blood Culture results may not be optimal due to an excessive volume of blood received in culture bottles   Culture   Final    NO GROWTH 1 DAY Performed at [facility], [address] [place], [place] [address]    Report Status PENDING  Incomplete  Aerobic Culture (superficial specimen)     Status: None (Preliminary result)   Collection Time: 04/20/17 11:00 AM  Result Value Ref Range Status   Specimen Description ABDOMEN  Final   Special Requests NONE   Final   Gram Stain   Final    MODERATE WBC PRESENT, PREDOMINANTLY PMN MODERATE GRAM NEGATIVE RODS Performed at [facility], [address] [place], [place] [address]    Culture ABUNDANT GRAM NEGATIVE RODS  Final   Report Status PENDING  Incomplete     Medications:   . acetaminophen  1,000 mg Oral TID  . atenolol  100 mg Oral Daily  . enoxaparin (LOVENOX) injection  40 mg Subcutaneous Q24H  . gabapentin  300 mg Oral QHS  . hydrochlorothiazide  25 mg Oral q morning - 10a   Continuous Infusions: . methocarbamol (ROBAXIN)  IV    . piperacillin-tazobactam (ZOSYN)  IV Stopped (04/22/17 0951)     LOS: 2 days   Marinda Elk  [facility] Pager [phone]  *Please refer to [email or web address], password [email or web address] to get updated schedule on who will round on this patient, as hospitalists switch teams weekly. If 7PM-7AM, please contact night-coverage at [email or web address], password [email or web address] for any overnight needs.  04/22/2017, 10:09 AM
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
## 2017-04-23 DIAGNOSIS — Z221 Carrier of other intestinal infectious diseases: Secondary | ICD-10-CM

## 2017-04-23 DIAGNOSIS — Z22358 Carrier of other enterobacterales: Secondary | ICD-10-CM

## 2017-04-23 LAB — BASIC METABOLIC PANEL
ANION GAP: 8 (ref 5–15)
BUN: 9 mg/dL (ref 6–20)
CO2: 29 mmol/L (ref 22–32)
Calcium: 9 mg/dL (ref 8.9–10.3)
Chloride: 103 mmol/L (ref 101–111)
Creatinine, Ser: 0.9 mg/dL (ref 0.61–1.24)
GFR calc Af Amer: >60 mL/min (ref >60)
Glucose, Bld: 84 mg/dL (ref 65–99)
POTASSIUM: 3.5 mmol/L (ref 3.5–5.1)
SODIUM: 140 mmol/L (ref 135–145)

## 2017-04-23 MED ORDER — CEFPODOXIME PROXETIL 200 MG PO TABS
200.0000 mg | ORAL_TABLET | Freq: Two times a day (BID) | ORAL | Status: DC
  Administered 2017-04-23 – 2017-04-24 (×3): 200 mg via ORAL
  Filled 2017-04-23 (×3): qty 1
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
## 2017-04-23 NOTE — Progress Notes (Signed)
[facility] PROGRESS NOTE    Progress Note  Thomas [name]  ZOX:[id number] DOB: March 17, 1996 DOA: 04/19/2017 PCP: Iva Boop, MD     Brief Narrative:   Thomas [name] is an 22 y.o. male  male with medical history significant of spina bifida,  lipomeningocele with resultant neurogenic bladder s/p ACE procedure at [facility], HTN, and morbid obesity; who presents with complaints of abdominal pain and swelling of the right lower quadrant over the last 4 days    Assessment/Plan:   Cellulitis and abscess of trunk Surgery was consulted to perform an I&D, on IV zosyn culture grew GNR. Fever resolved, no leucocytosis. Culture grew E. coli sensitive sensitive to Rocephin we will change to oral Vantin  Neurogenic bladder  Spina bifida ([facility])  Essential hypertension No changes made to his medication.  Hypokalemia Resolved.   DVT prophylaxis: lovenox Family Communication:mother Disposition Plan/Barrier to D/C: unable to determine Code Status:     Code Status Orders  (From admission, onward)        Start     Ordered   04/20/17 0102  Full code  Continuous     04/20/17 0107    Code Status History    Date Active Date Inactive Code Status Order ID Comments User Context   This patient has a current code status but no historical code status.        IV Access:    Peripheral IV   Procedures and diagnostic studies:   No results found.   Medical Consultants:    None.  Anti-Infectives:   IV Zosyn  Subjective:    Thomas [name] no complains feels great.  Objective:    Vitals:   04/22/17 0458 04/22/17 1340 04/22/17 1418 04/23/17 0601  BP: 123/68  136/76 135/69  Pulse: (!) 54 (!) 55 (!) 56 (!) 55  Resp: 16  16 17   Temp: 98.7 F (37.1 C)  98.2 F (36.8 C) 98.6 F (37 C)  TempSrc: Oral  Oral   SpO2: 97%  99% 100%  Weight:      Height:        Intake/Output Summary (Last 24 hours) at 04/23/2017 1048 Last data filed at 04/23/2017 0824 Gross  per 24 hour  Intake 460 ml  Output -  Net 460 ml   Filed Weights   04/20/17 0600  Weight: (!) 163.3 kg (360 lb)    Exam: General exam: In no acute distress. Respiratory system: Good air movement and clear to auscultation. Cardiovascular system: S1 & S2 heard, RRR. No JVD. Gastrointestinal system: Abdomen is nondistended, soft and nontender.  Extremities: No pedal edema. Skin: No rashes, lesions or ulcers Psychiatry: Judgement and insight appear normal. Mood & affect appropriate.    Data Reviewed:    Labs: Basic Metabolic Panel: Recent Labs  Lab 04/19/17 1836 04/20/17 0620 04/22/17 0632 04/23/17 0547  NA 138 137 138 140  K 3.2* 3.1* 3.7 3.5  CL 99* 101 103 103  CO2 28 25 28 29   GLUCOSE 93 102* 103* 84  BUN 11 10 11 9   CREATININE 1.09 0.90 0.95 0.90  CALCIUM 9.2 8.7* 8.6* 9.0   GFR Estimated Creatinine Clearance: 202.9 mL/min (by C-G formula based on SCr of 0.9 mg/dL). Liver Function Tests: Recent Labs  Lab 04/19/17 1836 04/20/17 0620  AST 73* 39  ALT 75* 58  ALKPHOS 89 71  BILITOT 2.3* 1.5*  PROT 8.2* 6.8  ALBUMIN 3.8 3.1*   No results for input(s): LIPASE, AMYLASE in the last  168 hours. No results for input(s): AMMONIA in the last 168 hours. Coagulation profile Recent Labs  Lab 04/20/17 0620  INR 1.09    CBC: Recent Labs  Lab 04/19/17 1836 04/20/17 0620 04/22/17 0632  WBC 14.6* 15.2* 10.1  NEUTROABS 11.3*  --   --   HGB 15.5 14.5 14.3  HCT 47.0 44.2 44.6  MCV 82.3 82.8 84.6  PLT 293 304 330   Cardiac Enzymes: No results for input(s): CKTOTAL, CKMB, CKMBINDEX, TROPONINI in the last 168 hours. BNP (last 3 results) No results for input(s): PROBNP in the last 8760 hours. CBG: No results for input(s): GLUCAP in the last 168 hours. D-Dimer: No results for input(s): DDIMER in the last 72 hours. Hgb A1c: No results for input(s): HGBA1C in the last 72 hours. Lipid Profile: No results for input(s): CHOL, HDL, LDLCALC, TRIG, CHOLHDL,  LDLDIRECT in the last 72 hours. Thyroid function studies: No results for input(s): TSH, T4TOTAL, T3FREE, THYROIDAB in the last 72 hours.  Invalid input(s): FREET3 Anemia work up: No results for input(s): VITAMINB12, FOLATE, FERRITIN, TIBC, IRON, RETICCTPCT in the last 72 hours. Sepsis Labs: Recent Labs  Lab 04/19/17 1836 04/19/17 1844 04/20/17 0620 04/22/17 0632  WBC 14.6*  --  15.2* 10.1  LATICACIDVEN  --  0.82  --   --    Microbiology Recent Results (from the past 240 hour(s))  Urine culture     Status: None   Collection Time: 04/20/17 12:41 AM  Result Value Ref Range Status   Specimen Description URINE, CATHETERIZED  Final   Special Requests Normal  Final   Culture   Final    NO GROWTH Performed at [facility], [address] [place], [place] [address]    Report Status 04/21/2017 FINAL  Final  Culture, blood (routine x 2)     Status: None (Preliminary result)   Collection Time: 04/20/17  1:50 AM  Result Value Ref Range Status   Specimen Description BLOOD RIGHT ANTECUBITAL  Final   Special Requests   Final    BOTTLES DRAWN AEROBIC AND ANAEROBIC Blood Culture adequate volume   Culture   Final    NO GROWTH 2 DAYS Performed at [facility], [address] [place], [place] [address]    Report Status PENDING  Incomplete  Culture, blood (routine x 2)     Status: None (Preliminary result)   Collection Time: 04/20/17  1:55 AM  Result Value Ref Range Status   Specimen Description BLOOD LEFT HAND  Final   Special Requests   Final    BOTTLES DRAWN AEROBIC AND ANAEROBIC Blood Culture results may not be optimal due to an excessive volume of blood received in culture bottles   Culture   Final    NO GROWTH 2 DAYS Performed at [facility], [address] [place], [place] [address]    Report Status PENDING  Incomplete  Aerobic Culture (superficial specimen)     Status: None   Collection Time: 04/20/17 11:00 AM  Result Value Ref Range Status   Specimen  Description ABDOMEN  Final   Special Requests NONE  Final   Gram Stain   Final    MODERATE WBC PRESENT, PREDOMINANTLY PMN MODERATE GRAM NEGATIVE RODS Performed at [facility], [address] [place], [place] [address]    Culture ABUNDANT ESCHERICHIA COLI  Final   Report Status 04/22/2017 FINAL  Final   Organism ID, Bacteria ESCHERICHIA COLI  Final  Susceptibility   Escherichia coli - MIC*    AMPICILLIN >=32 RESISTANT Resistant     CEFAZOLIN 32 INTERMEDIATE Intermediate     CEFEPIME <=1 SENSITIVE Sensitive     CEFTAZIDIME <=1 SENSITIVE Sensitive     CEFTRIAXONE <=1 SENSITIVE Sensitive     CIPROFLOXACIN >=4 RESISTANT Resistant     GENTAMICIN <=1 SENSITIVE Sensitive     IMIPENEM <=0.25 SENSITIVE Sensitive     TRIMETH/SULFA >=320 RESISTANT Resistant     AMPICILLIN/SULBACTAM >=32 RESISTANT Resistant     PIP/TAZO 16 SENSITIVE Sensitive     Extended ESBL NEGATIVE Sensitive     * ABUNDANT ESCHERICHIA COLI     Medications:   . acetaminophen  1,000 mg Oral TID  . atenolol  100 mg Oral Daily  . enoxaparin (LOVENOX) injection  0.5 mg/kg Subcutaneous Q24H  . gabapentin  300 mg Oral QHS  . hydrochlorothiazide  25 mg Oral q morning - 10a   Continuous Infusions: . methocarbamol (ROBAXIN)  IV    . piperacillin-tazobactam (ZOSYN)  IV Stopped (04/23/17 0935)     LOS: 3 days   Marinda Elk  [facility] Pager [phone]  *Please refer to [email or web address], password [email or web address] to get updated schedule on who will round on this patient, as hospitalists switch teams weekly. If 7PM-7AM, please contact night-coverage at [email or web address], password [email or web address] for any overnight needs.  04/23/2017, 10:48 AM

## 2017-04-24 DIAGNOSIS — D72829 Elevated white blood cell count, unspecified: Secondary | ICD-10-CM

## 2017-04-24 DIAGNOSIS — L02211 Cutaneous abscess of abdominal wall: Principal | ICD-10-CM

## 2017-04-24 LAB — BASIC METABOLIC PANEL
ANION GAP: 5 (ref 5–15)
BUN: 13 mg/dL (ref 6–20)
CALCIUM: 9.1 mg/dL (ref 8.9–10.3)
CO2: 32 mmol/L (ref 22–32)
Chloride: 102 mmol/L (ref 101–111)
Creatinine, Ser: 0.94 mg/dL (ref 0.61–1.24)
GFR calc non Af Amer: >60 mL/min (ref >60)
GLUCOSE: 93 mg/dL (ref 65–99)
POTASSIUM: 3.7 mmol/L (ref 3.5–5.1)
Sodium: 139 mmol/L (ref 135–145)

## 2017-04-24 MED ORDER — CEFPODOXIME PROXETIL 200 MG PO TABS: 200.0000 mg | ORAL_TABLET | Freq: Two times a day (BID) | ORAL | 0 refills | Status: DC

## 2017-04-24 NOTE — Discharge Summary (Signed)
Physician Discharge Summary  Sol PasserChristopher C Hegarty YYT:[id number] DOB: 1995/05/03 DOA: 04/19/2017  PCP: Iva BoopVia, Kevin, MD  Admit date: 04/19/2017 Discharge date: 04/24/2017  Admitted From: Home Disposition:  Home  Recommendations for Outpatient Follow-up:  1. Follow up with Surgery in 1-2 weeks   Home Health:Yes Equipment/Devices:None  Discharge Condition:stable CODE STATUS:full Diet recommendation: Heart Healthy   Brief/Interim Summary: 22 y.o. male  malewith medical history significant ofspina bifida,lipomeningocelewith resultant neurogenic bladder 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 coli sensitive sensitive to Rocephin we will change to oral Vantin  Neurogenic bladder  Spina bifida (HCC)  Essential hypertension No changes made to his medication.  Hypokalemia Resolved, with oral supplements.   Discharge Instructions  Discharge Instructions    Diet - low sodium heart healthy   Complete by:  As directed    Increase activity slowly   Complete by:  As directed      Allergies as of 04/24/2017      Reactions   Codeine Rash   Latex Rash, Swelling   Other reaction(s): Other (See Comments) Other Reaction: mouth swells   Lorazepam Other (See Comments)   Hallucinations Other reaction(s): Hallucinations, Other (See Comments) Other  Reaction: Hallucinations   Sulfa Antibiotics Rash   Vancomycin    Other reaction(s): Other (See Comments) Other Reaction: Redman's syndrome   Food    Tropical fruit   Ciprofloxacin Nausea Only   Other reaction(s): Other (See Comments), Vomiting chills      Medication List    TAKE these medications   atenolol 100 MG tablet Commonly known as:  TENORMIN Take 100 mg by mouth daily.   cefpodoxime 200 MG tablet Commonly known as:  VANTIN Take 1 tablet (200 mg total) by mouth every 12 (twelve) hours.   hydrochlorothiazide 25 MG tablet Commonly known as:  HYDRODIURIL Take 25 mg by mouth every morning.   ibuprofen 200 MG tablet Commonly known as:  ADVIL,MOTRIN Take 400 mg by mouth every 6 (six) hours as needed. Patient used this medication for his headache.      Follow-up Information    [facility] [place]. Schedule an appointment as soon as possible for a visit in 2 week(s).   Specialty:  General Surgery Why:  for wound check  Contact information: [address] [place] [id number] [phone]         Allergies  Allergen Reactions  . Codeine Rash  . Latex Rash and Swelling    Other reaction(s): Other (See Comments) Other Reaction: mouth swells   . Lorazepam Other (See Comments)    Hallucinations Other reaction(s): Hallucinations, Other (See Comments) Other Reaction: Hallucinations   . Sulfa Antibiotics Rash  . Vancomycin     Other reaction(s): Other (See Comments) Other Reaction: Redman's syndrome  . Food     Tropical fruit  . Ciprofloxacin Nausea Only  Other reaction(s): Other (See Comments), Vomiting chills    Consultations:  Surgery   Procedures/Studies: Ct Abdomen Pelvis W Contrast  Result Date: 04/19/2017 CLINICAL DATA:  22 y/o M; right lateral abdomen abdominal pain, question abscess. EXAM: CT ABDOMEN AND PELVIS WITH CONTRAST TECHNIQUE: Multidetector CT imaging of the abdomen and pelvis was  performed using the standard protocol following bolus administration of intravenous contrast. CONTRAST:  ISOVUE-300 IOPAMIDOL (ISOVUE-300) INJECTION 61% COMPARISON:  04/19/2017 abdominal ultrasound. FINDINGS: Lower chest: No acute abnormality. Hepatobiliary: Hepatic steatosis. No focal liver lesion identified. Increased attenuation within the dependent gallbladder probably represents sludge and/or small stones. No biliary ductal dilatation. Pancreas: Unremarkable. No pancreatic ductal dilatation or surrounding inflammatory changes. Spleen: Normal in size without focal abnormality. Adrenals/Urinary Tract: Atrophic right kidney. Normal appearance of left kidney. Normal adrenal glands. No hydronephrosis. Redundant bladder with mild wall thickening. Stomach/Bowel: Within the right lower anterior abdominal wall there is an air-filled cavity extending to the abdominal wall musculature. At the deep end of the cavity there is a fluid collection measuring 3.5 cm that extends to the abdominal wall musculature (series 5 image 46). Within the peritoneal cavity a loop of colon is tethered to the abdominal wall musculature and may be connected by fistula to the overlying fluid collection. No obstructive or inflammatory changes of the bowel identified. Right lower quadrant bowel anastomosis without inflammatory or proximal obstruction. Vascular/Lymphatic: No significant vascular findings are present. No enlarged abdominal or pelvic lymph nodes. Reproductive: Prostate is unremarkable. Other: No ascites. Musculoskeletal: Complex lumbar and sacral dysraphism. No acute osseous abnormality identified. IMPRESSION: 1. Air and fluid-filled collection with right lower anterior abdominal wall subcutaneous fat likely connected to a subjacent tethered loop of colon by fistula. 2. Hepatic steatosis. 3. Gallbladder sludge and/or small stones. 4. Atrophic right kidney. 5. Redundant bladder with mild wall thickening, question neurogenic  bladder. 6. Complex lumbar and sacral dysraphism. Electronically Signed   By: Mitzi Hansen M.D.   On: 04/19/2017 22:15   US Abdomen Limited  Result Date: 04/19/2017 CLINICAL DATA:  Pain at site of prior port, some redness EXAM: ULTRASOUND ABDOMEN LIMITED COMPARISON:  None. FINDINGS: Ultrasound over the area in question was performed. There is some fluid within the subcutaneous soft tissues extending toward the peritoneal cavity. However much of this area is obscured by bowel gas and cannot be evaluated well by ultrasound. Therefore CT of the abdomen pelvis is recommended to assess for possible abscess which may communicate with the peritoneal cavity. IMPRESSION: There is fluid within the subcutaneous soft tissues extending deeper possibly into the peritoneal cavity. Recommend CT of the abdomen pelvis as noted above. Electronically Signed   By: Dwyane Dee M.D.   On: 04/19/2017 12:27    Subjective: No complains  Discharge Exam: Vitals:   04/24/17 0502 04/24/17 0900  BP: (!) 156/86 (!) 168/110  Pulse: 60 67  Resp: 17   Temp: 98 F (36.7 C)   SpO2: 94%    Vitals:   04/23/17 1321 04/23/17 2051 04/24/17 0502 04/24/17 0900  BP: (!) 157/87 (!) 162/86 (!) 156/86 (!) 168/110  Pulse: 65 71 60 67  Resp: 18 16 17    Temp: 98 F (36.7 C) 98.4 F (36.9 C) 98 F (36.7 C)   TempSrc: Oral Oral Oral   SpO2: 97% 95% 94%   Weight:      Height:        General: Pt is alert, awake, not in acute distress Cardiovascular: RRR, S1/S2 +, no rubs, no gallops  Respiratory: CTA bilaterally, no wheezing, no rhonchi Abdominal: Soft, NT, ND, bowel sounds + Extremities: no edema, no cyanosis    The results of significant diagnostics from this hospitalization (including imaging, microbiology, ancillary and laboratory) are listed below for reference.     Microbiology: Recent Results (from the past 240 hour(s))  Urine culture     Status: None   Collection Time: 04/20/17 12:41 AM  Result Value  Ref Range Status   Specimen Description URINE, CATHETERIZED  Final   Special Requests Normal  Final   Culture   Final    NO GROWTH Performed at [facility], [address], [place], [place] [address]    Report Status 04/21/2017 FINAL  Final  Culture, blood (routine x 2)     Status: None (Preliminary result)   Collection Time: 04/20/17  1:50 AM  Result Value Ref Range Status   Specimen Description BLOOD RIGHT ANTECUBITAL  Final   Special Requests   Final    BOTTLES DRAWN AEROBIC AND ANAEROBIC Blood Culture adequate volume   Culture   Final    NO GROWTH 3 DAYS Performed at [facility] [place] [facility], [address], [place], [place] [address]    Report Status PENDING  Incomplete  Culture, blood (routine x 2)     Status: None (Preliminary result)   Collection Time: 04/20/17  1:55 AM  Result Value Ref Range Status   Specimen Description BLOOD LEFT HAND  Final   Special Requests   Final    BOTTLES DRAWN AEROBIC AND ANAEROBIC Blood Culture results may not be optimal due to an excessive volume of blood received in culture bottles   Culture   Final    NO GROWTH 3 DAYS Performed at [facility], [address] [place], [place] [address]    Report Status PENDING  Incomplete  Aerobic Culture (superficial specimen)     Status: None   Collection Time: 04/20/17 11:00 AM  Result Value Ref Range Status   Specimen Description ABDOMEN  Final   Special Requests NONE  Final   Gram Stain   Final    MODERATE WBC PRESENT, PREDOMINANTLY PMN MODERATE GRAM NEGATIVE RODS Performed at [facility], [address], [place], [place] [address]    Culture ABUNDANT ESCHERICHIA COLI  Final   Report Status 04/22/2017 FINAL  Final   Organism ID, Bacteria ESCHERICHIA COLI  Final      Susceptibility   Escherichia coli - MIC*    AMPICILLIN >=32 RESISTANT Resistant     CEFAZOLIN 32 INTERMEDIATE Intermediate     CEFEPIME <=1 SENSITIVE Sensitive     CEFTAZIDIME <=1 SENSITIVE Sensitive      CEFTRIAXONE <=1 SENSITIVE Sensitive     CIPROFLOXACIN >=4 RESISTANT Resistant     GENTAMICIN <=1 SENSITIVE Sensitive     IMIPENEM <=0.25 SENSITIVE Sensitive     TRIMETH/SULFA >=320 RESISTANT Resistant     AMPICILLIN/SULBACTAM >=32 RESISTANT Resistant     PIP/TAZO 16 SENSITIVE Sensitive     Extended ESBL NEGATIVE Sensitive     * ABUNDANT ESCHERICHIA COLI     Labs: BNP (last 3 results) No results for input(s): BNP in the last 8760 hours. Basic Metabolic Panel: Recent Labs  Lab 04/19/17 1836 04/20/17 0620 04/22/17 0632 04/23/17 0547 04/24/17 0544  NA 138 137 138 140 139  K 3.2* 3.1* 3.7 3.5 3.7  CL 99* 101 103 103 102  CO2 28 25 28 29  32  GLUCOSE 93 102* 103* 84  93  BUN 11 10 11 9 13   CREATININE 1.09 0.90 0.95 0.90 0.94  CALCIUM 9.2 8.7* 8.6* 9.0 9.1   Liver Function Tests: Recent Labs  Lab 04/19/17 1836 04/20/17 0620  AST 73* 39  ALT 75* 58  ALKPHOS 89 71  BILITOT 2.3* 1.5*  PROT 8.2* 6.8  ALBUMIN 3.8 3.1*   No results for input(s): LIPASE, AMYLASE in the last 168 hours. No results for input(s): AMMONIA in the last 168 hours. CBC: Recent Labs  Lab 04/19/17 1836 04/20/17 0620 04/22/17 0632  WBC 14.6* 15.2* 10.1  NEUTROABS 11.3*  --   --   HGB 15.5 14.5 14.3  HCT 47.0 44.2 44.6  MCV 82.3 82.8 84.6  PLT 293 304 330   Cardiac Enzymes: No results for input(s): CKTOTAL, CKMB, CKMBINDEX, TROPONINI in the last 168 hours. BNP: Invalid input(s): POCBNP CBG: No results for input(s): GLUCAP in the last 168 hours. D-Dimer No results for input(s): DDIMER in the last 72 hours. Hgb A1c No results for input(s): HGBA1C in the last 72 hours. Lipid Profile No results for input(s): CHOL, HDL, LDLCALC, TRIG, CHOLHDL, LDLDIRECT in the last 72 hours. Thyroid function studies No results for input(s): TSH, T4TOTAL, T3FREE, THYROIDAB in the last 72 hours.  Invalid input(s): FREET3 Anemia work up No results for input(s): VITAMINB12, FOLATE, FERRITIN, TIBC, IRON,  RETICCTPCT in the last 72 hours. Urinalysis    Component Value Date/Time   COLORURINE AMBER (A) 04/19/2017 1851   APPEARANCEUR HAZY (A) 04/19/2017 1851   LABSPEC 1.014 04/19/2017 1851   PHURINE 6.0 04/19/2017 1851   GLUCOSEU NEGATIVE 04/19/2017 1851   HGBUR SMALL (A) 04/19/2017 1851   BILIRUBINUR NEGATIVE 04/19/2017 1851   KETONESUR NEGATIVE 04/19/2017 1851   PROTEINUR NEGATIVE 04/19/2017 1851   UROBILINOGEN 2.0 (H) 08/15/2009 1848   NITRITE NEGATIVE 04/19/2017 1851   LEUKOCYTESUR MODERATE (A) 04/19/2017 1851   Sepsis Labs Invalid input(s): PROCALCITONIN,  WBC,  LACTICIDVEN Microbiology Recent Results (from the past 240 hour(s))  Urine culture     Status: None   Collection Time: 04/20/17 12:41 AM  Result Value Ref Range Status   Specimen Description URINE, CATHETERIZED  Final   Special Requests Normal  Final   Culture   Final    NO GROWTH Performed at [facility], [address], [place], [place] [address]    Report Status 04/21/2017 FINAL  Final  Culture, blood (routine x 2)     Status: None (Preliminary result)   Collection Time: 04/20/17  1:50 AM  Result Value Ref Range Status   Specimen Description BLOOD RIGHT ANTECUBITAL  Final   Special Requests   Final    BOTTLES DRAWN AEROBIC AND ANAEROBIC Blood Culture adequate volume   Culture   Final    NO GROWTH 3 DAYS Performed at [facility], [address] [place], [place] [address]    Report Status PENDING  Incomplete  Culture, blood (routine x 2)     Status: None (Preliminary result)   Collection Time: 04/20/17  1:55 AM  Result Value Ref Range Status   Specimen Description BLOOD LEFT HAND  Final   Special Requests   Final    BOTTLES DRAWN AEROBIC AND ANAEROBIC Blood Culture results may not be optimal due to an excessive volume of blood received in culture bottles   Culture   Final    NO GROWTH 3 DAYS Performed at [facility], [address] [place], [place] [address]  Report Status PENDING   Incomplete  Aerobic Culture (superficial specimen)     Status: None   Collection Time: 04/20/17 11:00 AM  Result Value Ref Range Status   Specimen Description ABDOMEN  Final   Special Requests NONE  Final   Gram Stain   Final    MODERATE WBC PRESENT, PREDOMINANTLY PMN MODERATE GRAM NEGATIVE RODS Performed at [facility], [address] [place], [place] [address]    Culture ABUNDANT ESCHERICHIA COLI  Final   Report Status 04/22/2017 FINAL  Final   Organism ID, Bacteria ESCHERICHIA COLI  Final      Susceptibility   Escherichia coli - MIC*    AMPICILLIN >=32 RESISTANT Resistant     CEFAZOLIN 32 INTERMEDIATE Intermediate     CEFEPIME <=1 SENSITIVE Sensitive     CEFTAZIDIME <=1 SENSITIVE Sensitive     CEFTRIAXONE <=1 SENSITIVE Sensitive     CIPROFLOXACIN >=4 RESISTANT Resistant     GENTAMICIN <=1 SENSITIVE Sensitive     IMIPENEM <=0.25 SENSITIVE Sensitive     TRIMETH/SULFA >=320 RESISTANT Resistant     AMPICILLIN/SULBACTAM >=32 RESISTANT Resistant     PIP/TAZO 16 SENSITIVE Sensitive     Extended ESBL NEGATIVE Sensitive     * ABUNDANT ESCHERICHIA COLI     Time coordinating discharge: Over 30 minutes  SIGNED:   Marinda Elk, MD  [facility] 04/24/2017, 12:10 PM Pager   If 7PM-7AM, please contact night-coverage [email or web address] Password [email or web address]

## 2017-04-24 NOTE — Progress Notes (Signed)
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 Coli - on PO Vantin  Follow up: [facility] clinic 2 weeks for wound check  DISPO: Okay for discharge from a surgical standpoint    LOS: 4 days    Subjective:  CC: abscess  Pt states he is feeling much better. Mom at bedside. No new complaints  Objective: Vital signs in last 24 hours: Temp:  [98 F (36.7 C)-98.4 F (36.9 C)] 98 F (36.7 C) (01/21 0502) Pulse Rate:  [60-71] 67 (01/21 0900) Resp:  [16-18] 17 (01/21 0502) BP: (156-168)/(86-110) 168/110 (01/21 0900) SpO2:  [94 %-97 %] 94 % (01/21 0502) Last BM Date: 04/20/17  Intake/Output from previous day: 01/20 0701 - 01/21 0700 In: 790 [P.O.:790] Out: -  Intake/Output this shift: No intake/output data recorded.  PE: Gen:  Alert, NAD, pleasant, cooperative, obese Pulm:  Rate and effort normal Abd: obese, not distended, wound to RLQ without surrounding erythema and minimal induration, no drainage, no TTP   Anti-infectives: Anti-infectives (From admission, onward)   Start     Dose/Rate Route Frequency Ordered Stop   04/23/17 1130  cefpodoxime (VANTIN) tablet 200 mg     200 mg Oral Every 12 hours 04/23/17 1051     04/21/17 1400  piperacillin-tazobactam (ZOSYN) IVPB 3.375 g  Status:  Discontinued     3.375 g 12.5 mL/hr over 240 Minutes Intravenous Every 8 hours 04/21/17 0815 04/23/17 1051   04/21/17 0830  piperacillin-tazobactam (ZOSYN) IVPB 3.375 g     3.375 g 100 mL/hr over 30 Minutes Intravenous  Once 04/21/17 0815 04/21/17 1034    04/20/17 0115  ceFAZolin (ANCEF) IVPB 2g/100 mL premix  Status:  Discontinued     2 g 200 mL/hr over 30 Minutes Intravenous Every 8 hours 04/20/17 0107 04/21/17 0756      Lab Results:  Recent Labs    04/22/17 0632  WBC 10.1  HGB 14.3  HCT 44.6  PLT 330   BMET Recent Labs    04/23/17 0547 04/24/17 0544  NA 140 139  K 3.5 3.7  CL 103 102  CO2 29 32  GLUCOSE 84 93  BUN 9 13  CREATININE 0.90 0.94  CALCIUM 9.0 9.1   PT/INR No results for input(s): LABPROT, INR in the last 72 hours. CMP     Component Value Date/Time   NA 139 04/24/2017 0544   K 3.7 04/24/2017 0544   CL 102 04/24/2017 0544   CO2 32 04/24/2017 0544   GLUCOSE 93 04/24/2017 0544   BUN 13 04/24/2017 0544   CREATININE 0.94 04/24/2017 0544   CALCIUM 9.1 04/24/2017 0544   PROT 6.8 04/20/2017 0620   ALBUMIN 3.1 (L) 04/20/2017 0620   AST 39 04/20/2017 0620   ALT 58 04/20/2017 0620   ALKPHOS 71 04/20/2017 0620   BILITOT 1.5 (H) 04/20/2017 0620   GFRNONAA >60 04/24/2017 0544   GFRAA >60 04/24/2017 0544   Lipase  No results found for: LIPASE  Studies/Results: No results found.    Jerre SimonJessica L Nekhi Liwanag , PA-C [facility] 04/24/2017, 12:03 PM Pager: [phone] Consults: [phone] Mon-Fri 7:00 am-4:30 pm Sat-Sun 7:00 am-11:30 am

## 2017-04-25 LAB — CULTURE, BLOOD (ROUTINE X 2)
CULTURE: NO GROWTH
CULTURE: NO GROWTH
Special Requests: ADEQUATE

## 2017-05-22 DIAGNOSIS — L02211 Cutaneous abscess of abdominal wall: Secondary | ICD-10-CM | POA: Diagnosis not present

## 2017-06-04 ENCOUNTER — Encounter (HOSPITAL_COMMUNITY): Payer: Self-pay | Admitting: *Deleted

## 2017-06-04 ENCOUNTER — Observation Stay (HOSPITAL_COMMUNITY)
Admission: EM | Admit: 2017-06-04 | Discharge: 2017-06-07 | Disposition: A | Discharge status: Home / Self Care | Payer: 59 | Attending: Family Medicine | Admitting: Family Medicine

## 2017-06-04 ENCOUNTER — Other Ambulatory Visit: Payer: Self-pay

## 2017-06-04 ENCOUNTER — Telehealth: Payer: Self-pay | Admitting: Surgery

## 2017-06-04 ENCOUNTER — Observation Stay (HOSPITAL_COMMUNITY): Payer: 59

## 2017-06-04 DIAGNOSIS — L02219 Cutaneous abscess of trunk, unspecified: Secondary | ICD-10-CM | POA: Diagnosis not present

## 2017-06-04 DIAGNOSIS — R1900 Intra-abdominal and pelvic swelling, mass and lump, unspecified site: Secondary | ICD-10-CM | POA: Diagnosis not present

## 2017-06-04 DIAGNOSIS — M79604 Pain in right leg: Secondary | ICD-10-CM | POA: Diagnosis not present

## 2017-06-04 DIAGNOSIS — I129 Hypertensive chronic kidney disease with stage 1 through stage 4 chronic kidney disease, or unspecified chronic kidney disease: Secondary | ICD-10-CM | POA: Diagnosis not present

## 2017-06-04 DIAGNOSIS — L03319 Cellulitis of trunk, unspecified: Secondary | ICD-10-CM | POA: Diagnosis not present

## 2017-06-04 DIAGNOSIS — L02211 Cutaneous abscess of abdominal wall: Secondary | ICD-10-CM | POA: Diagnosis not present

## 2017-06-04 DIAGNOSIS — D72829 Elevated white blood cell count, unspecified: Secondary | ICD-10-CM | POA: Diagnosis not present

## 2017-06-04 DIAGNOSIS — L0291 Cutaneous abscess, unspecified: Secondary | ICD-10-CM | POA: Diagnosis not present

## 2017-06-04 DIAGNOSIS — E876 Hypokalemia: Secondary | ICD-10-CM | POA: Diagnosis not present

## 2017-06-04 DIAGNOSIS — I10 Essential (primary) hypertension: Secondary | ICD-10-CM | POA: Diagnosis present

## 2017-06-04 DIAGNOSIS — N319 Neuromuscular dysfunction of bladder, unspecified: Secondary | ICD-10-CM | POA: Diagnosis present

## 2017-06-04 DIAGNOSIS — Q059 Spina bifida, unspecified: Secondary | ICD-10-CM

## 2017-06-04 DIAGNOSIS — Z6841 Body Mass Index (BMI) 40.0 and over, adult: Secondary | ICD-10-CM | POA: Insufficient documentation

## 2017-06-04 DIAGNOSIS — M79606 Pain in leg, unspecified: Secondary | ICD-10-CM | POA: Insufficient documentation

## 2017-06-04 DIAGNOSIS — Z221 Carrier of other intestinal infectious diseases: Secondary | ICD-10-CM

## 2017-06-04 DIAGNOSIS — G8929 Other chronic pain: Secondary | ICD-10-CM | POA: Insufficient documentation

## 2017-06-04 DIAGNOSIS — M79605 Pain in left leg: Secondary | ICD-10-CM | POA: Diagnosis not present

## 2017-06-04 LAB — PROTIME-INR
INR: 1.09
Prothrombin Time: 14 seconds (ref 11.4–15.2)

## 2017-06-04 LAB — COMPREHENSIVE METABOLIC PANEL
ALBUMIN: 3.5 g/dL (ref 3.5–5.0)
ALK PHOS: 84 U/L (ref 38–126)
ALT: 43 U/L (ref 17–63)
AST: 32 U/L (ref 15–41)
Anion gap: 9 (ref 5–15)
BILIRUBIN TOTAL: 2.1 mg/dL — AB (ref 0.3–1.2)
BUN: 11 mg/dL (ref 6–20)
CALCIUM: 8.8 mg/dL — AB (ref 8.9–10.3)
CO2: 26 mmol/L (ref 22–32)
CREATININE: 0.83 mg/dL (ref 0.61–1.24)
Chloride: 103 mmol/L (ref 101–111)
GFR calc Af Amer: >60 mL/min (ref >60)
GLUCOSE: 84 mg/dL (ref 65–99)
Potassium: 3.4 mmol/L — ABNORMAL LOW (ref 3.5–5.1)
Sodium: 138 mmol/L (ref 135–145)
TOTAL PROTEIN: 7.5 g/dL (ref 6.5–8.1)

## 2017-06-04 LAB — CBC WITH DIFFERENTIAL/PLATELET
BASOS ABS: 0 10*3/uL (ref 0.0–0.1)
BASOS PCT: 0 %
Eosinophils Absolute: 0.1 10*3/uL (ref 0.0–0.7)
Eosinophils Relative: 1 %
HEMATOCRIT: 44 % (ref 39.0–52.0)
HEMOGLOBIN: 14.2 g/dL (ref 13.0–17.0)
LYMPHS ABS: 1.8 10*3/uL (ref 0.7–4.0)
Lymphocytes Relative: 13 %
MCH: 27.4 pg (ref 26.0–34.0)
MCHC: 32.3 g/dL (ref 30.0–36.0)
MCV: 84.9 fL (ref 78.0–100.0)
MONOS PCT: 11 %
Monocytes Absolute: 1.6 10*3/uL — ABNORMAL HIGH (ref 0.1–1.0)
Neutro Abs: 10.7 10*3/uL — ABNORMAL HIGH (ref 1.7–7.7)
Neutrophils Relative %: 75 %
Platelets: 311 10*3/uL (ref 150–400)
RBC: 5.18 MIL/uL (ref 4.22–5.81)
RDW: 15.1 % (ref 11.5–15.5)
WBC: 14.2 10*3/uL — AB (ref 4.0–10.5)

## 2017-06-04 LAB — TYPE AND SCREEN
ABO/RH(D): O POS
Antibody Screen: NEGATIVE

## 2017-06-04 MED ORDER — HYDROCORTISONE 1 % EX CREA
1.0000 | TOPICAL_CREAM | Freq: Three times a day (TID) | CUTANEOUS | Status: DC | PRN
Start: 2017-06-04 — End: 2017-06-06
  Filled 2017-06-04: qty 28

## 2017-06-04 MED ORDER — LIDOCAINE HCL (PF) 2 % IJ SOLN
0.0000 mL | Freq: Once | INTRAMUSCULAR | Status: DC | PRN
  Filled 2017-06-04: qty 20

## 2017-06-04 MED ORDER — SODIUM CHLORIDE 0.9 % IV SOLN
Freq: Once | INTRAVENOUS | Status: AC
  Administered 2017-06-04: 13:00:00 via INTRAVENOUS

## 2017-06-04 MED ORDER — GUAIFENESIN-DM 100-10 MG/5ML PO SYRP: 10.0000 mL | ORAL_SOLUTION | ORAL | Status: DC | PRN

## 2017-06-04 MED ORDER — HYDROCHLOROTHIAZIDE 25 MG PO TABS
25.0000 mg | ORAL_TABLET | Freq: Every morning | ORAL | Status: DC
  Administered 2017-06-06 – 2017-06-07 (×2): 25 mg via ORAL
  Filled 2017-06-04 (×2): qty 1

## 2017-06-04 MED ORDER — HYDROCORTISONE 2.5 % RE CREA
1.0000 | TOPICAL_CREAM | Freq: Four times a day (QID) | RECTAL | Status: DC | PRN
Start: 2017-06-04 — End: 2017-06-07
  Filled 2017-06-04: qty 28.35

## 2017-06-04 MED ORDER — HYDROMORPHONE HCL 1 MG/ML IJ SOLN
1.0000 mg | INTRAMUSCULAR | Status: DC | PRN
  Administered 2017-06-06: 1 mg via INTRAVENOUS
  Filled 2017-06-04: qty 1

## 2017-06-04 MED ORDER — LACTATED RINGERS IV BOLUS (SEPSIS): 1000.0000 mL | Freq: Three times a day (TID) | INTRAVENOUS | Status: AC | PRN

## 2017-06-04 MED ORDER — HYDROMORPHONE HCL 1 MG/ML IJ SOLN: 1.0000 mg | Freq: Once | INTRAMUSCULAR | Status: DC

## 2017-06-04 MED ORDER — SODIUM CHLORIDE 0.9 % IJ SOLN
INTRAMUSCULAR | Status: AC
  Filled 2017-06-04: qty 50

## 2017-06-04 MED ORDER — PHENOL 1.4 % MT LIQD
1.0000 | OROMUCOSAL | Status: DC | PRN
  Filled 2017-06-04: qty 177

## 2017-06-04 MED ORDER — NAPROXEN SODIUM 220 MG PO TABS: 440.0000 mg | ORAL_TABLET | Freq: Two times a day (BID) | ORAL | Status: DC | PRN

## 2017-06-04 MED ORDER — HYDROMORPHONE HCL 1 MG/ML IJ SOLN
INTRAMUSCULAR | Status: AC
  Filled 2017-06-04: qty 1

## 2017-06-04 MED ORDER — LIP MEDEX EX OINT
1.0000 "application " | TOPICAL_OINTMENT | Freq: Two times a day (BID) | CUTANEOUS | Status: DC
  Administered 2017-06-05 – 2017-06-07 (×5): 1 via TOPICAL
  Filled 2017-06-04 (×2): qty 7

## 2017-06-04 MED ORDER — VANCOMYCIN HCL IN DEXTROSE 1-5 GM/200ML-% IV SOLN
1000.0000 mg | Freq: Once | INTRAVENOUS | Status: AC
  Administered 2017-06-04: 1000 mg via INTRAVENOUS
  Filled 2017-06-04: qty 200

## 2017-06-04 MED ORDER — MAGIC MOUTHWASH
15.0000 mL | Freq: Four times a day (QID) | ORAL | Status: DC | PRN
  Filled 2017-06-04: qty 15

## 2017-06-04 MED ORDER — PIPERACILLIN-TAZOBACTAM 3.375 G IVPB 30 MIN
3.3750 g | Freq: Once | INTRAVENOUS | Status: AC
  Administered 2017-06-04: 3.375 g via INTRAVENOUS
  Filled 2017-06-04: qty 50

## 2017-06-04 MED ORDER — NAPROXEN SODIUM 275 MG PO TABS
550.0000 mg | ORAL_TABLET | Freq: Two times a day (BID) | ORAL | Status: DC | PRN
  Filled 2017-06-04: qty 2

## 2017-06-04 MED ORDER — ACETAMINOPHEN 650 MG RE SUPP: 650.0000 mg | Freq: Four times a day (QID) | RECTAL | Status: DC | PRN

## 2017-06-04 MED ORDER — MENTHOL 3 MG MT LOZG
1.0000 | LOZENGE | OROMUCOSAL | Status: DC | PRN
Start: 2017-06-04 — End: 2017-06-07

## 2017-06-04 MED ORDER — DIPHENHYDRAMINE HCL 50 MG/ML IJ SOLN: 12.5000 mg | Freq: Four times a day (QID) | INTRAMUSCULAR | Status: DC | PRN

## 2017-06-04 MED ORDER — LIDOCAINE HCL 2 % IJ SOLN: 0.0000 mL | Freq: Once | INTRAMUSCULAR | Status: DC

## 2017-06-04 MED ORDER — DIPHENHYDRAMINE HCL 25 MG PO CAPS: 25.0000 mg | ORAL_CAPSULE | Freq: Four times a day (QID) | ORAL | Status: DC | PRN

## 2017-06-04 MED ORDER — ATENOLOL 50 MG PO TABS
100.0000 mg | ORAL_TABLET | Freq: Every day | ORAL | Status: DC
  Administered 2017-06-05 – 2017-06-07 (×3): 100 mg via ORAL
  Filled 2017-06-04 (×2): qty 2
  Filled 2017-06-04: qty 1
  Filled 2017-06-04: qty 2

## 2017-06-04 MED ORDER — HEPARIN SODIUM (PORCINE) 5000 UNIT/ML IJ SOLN
5000.0000 [IU] | Freq: Three times a day (TID) | INTRAMUSCULAR | Status: DC
  Administered 2017-06-04 – 2017-06-05 (×2): 5000 [IU] via SUBCUTANEOUS
  Filled 2017-06-04 (×2): qty 1

## 2017-06-04 MED ORDER — ONDANSETRON HCL 4 MG/2ML IJ SOLN
4.0000 mg | Freq: Four times a day (QID) | INTRAMUSCULAR | Status: DC | PRN
  Administered 2017-06-05: 4 mg via INTRAVENOUS

## 2017-06-04 MED ORDER — ONDANSETRON HCL 4 MG PO TABS: 4.0000 mg | ORAL_TABLET | Freq: Four times a day (QID) | ORAL | Status: DC | PRN

## 2017-06-04 MED ORDER — ALUM & MAG HYDROXIDE-SIMETH 200-200-20 MG/5ML PO SUSP: 30.0000 mL | Freq: Four times a day (QID) | ORAL | Status: DC | PRN

## 2017-06-04 MED ORDER — IOPAMIDOL (ISOVUE-300) INJECTION 61%
INTRAVENOUS | Status: AC
  Administered 2017-06-04: 100 mL
  Filled 2017-06-04: qty 100

## 2017-06-04 MED ORDER — PIPERACILLIN-TAZOBACTAM 3.375 G IVPB 30 MIN
3.3750 g | Freq: Three times a day (TID) | INTRAVENOUS | Status: DC
  Administered 2017-06-04 – 2017-06-07 (×8): 3.375 g via INTRAVENOUS
  Filled 2017-06-04 (×16): qty 50

## 2017-06-04 MED ORDER — HYDRALAZINE HCL 20 MG/ML IJ SOLN: 10.0000 mg | Freq: Three times a day (TID) | INTRAMUSCULAR | Status: DC | PRN

## 2017-06-04 MED ORDER — ACETAMINOPHEN 325 MG PO TABS
650.0000 mg | ORAL_TABLET | Freq: Four times a day (QID) | ORAL | Status: DC | PRN
  Administered 2017-06-05: 650 mg via ORAL
  Filled 2017-06-04: qty 2

## 2017-06-04 NOTE — ED Provider Notes (Signed)
[facility]-EMERGENCY DEPT Provider Note  CSN: [id number] Arrival date & time: 06/04/17 1158  Chief Complaint(s) Wound Infection  HPI Sol PasserChristopher C [name] is a 22 y.o. 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  This was managed by surgery who performed incision and drainage.  Patient has been performing packing and states that the wound has been healing well.  The wound closed approximately 2-3 days ago.  Since then he noted new erythema and swelling in the site.  He is endorsing mild discomfort.  Also endorses nightly subjective fevers which resolved with Motrin.  Denies any nausea or vomiting.  Denies any abdominal pain.  No chest pain or shortness of breath.  Denies any other physical complaints at this time.  They spoke with Dr. Michaell CowingGross from general surgery who recommended patient present to the emergency department for admission for IV antibiotics and possible incision and drainage  HPI  Past Medical History Past Medical History:  Diagnosis Date  . Hypertension   . Spina bifida [facility](HCC)    Patient Active Problem List   Diagnosis Date Noted  . Chronic leg pain 06/04/2017  . 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 Morbid obesity with BMI of 50.0-59.9, adult (HCC) 04/22/2017  . Abscess of abdominal wall s/p I&D 04/20/2017 04/22/2017  . Cellulitis and abscess of trunk 04/20/2017  . Leukocytosis 04/20/2017  . Neurogenic bladder 04/20/2017  . Spina bifida (HCC) 04/20/2017  . Essential hypertension with goal blood pressure less than 130/85 04/20/2017  . Hypokalemia 04/20/2017  . Snoring 05/21/2015  . Hypersomnia 05/21/2015  . Mild vitamin D deficiency 01/28/2015  . Hypertension, renal 05/28/2013  . Neurogenic bowel, not elsewhere classified  01/06/2012  . Neuromuscular dysfunction of bladder 01/06/2012  . Lipomeningocele (HCC) 01/06/2012   Home Medication(s) Prior to Admission medications   Medication Sig Start Date End Date Taking? Authorizing Provider  acetaminophen (TYLENOL) 500 MG tablet Take 1,000 mg by mouth every 6 (six) hours as needed for mild pain.   Yes [provider]  atenolol (TENORMIN) 100 MG tablet Take 100 mg by mouth daily. 02/02/17  Yes [provider]  hydrochlorothiazide (HYDRODIURIL) 25 MG tablet Take 25 mg by mouth every morning. 02/02/17  Yes [provider]  naproxen sodium (ALEVE) 220 MG tablet Take 440 mg by mouth 2 (two) times daily as needed (pain).   Yes [provider]  cefpodoxime (VANTIN) 200 MG tablet Take 1 tablet (200 mg total) by mouth every 12 (twelve) hours. Patient not taking: Reported on 06/04/2017 04/24/17   Marinda ElkFeliz Ortiz, Abraham, MD  Past Surgical History Past Surgical History:  Procedure Laterality Date  . ADENOIDECTOMY    . BACK SURGERY    . BLADDER SURGERY    . FOOT SURGERY    . FRACTURE SURGERY    . PORT-A-CATH REMOVAL    . STOMACH SURGERY    . TONSILLECTOMY     Family History Family History  Problem Relation Age of Onset  . Asthma Other   . Cancer Other   . COPD Other   . Hyperlipidemia Other   . Hypertension Other   . Stroke Other     Social History Social History   Tobacco Use  . Smoking status: Never Smoker  . Smokeless tobacco: Never Used  Substance Use Topics  . Alcohol use: No  . Drug use: No   Allergies Codeine; Latex; Lorazepam; Sulfa antibiotics; Vancomycin; Food; and Ciprofloxacin  Review of Systems Review of Systems All other systems are reviewed and are negative for acute change except as noted in the HPI  Physical Exam Vital Signs  I have reviewed the triage vital signs BP 138/87  (BP Location: Right Arm)   Pulse 68   Temp 98.4 F (36.9 C) (Oral)   Resp 16   Ht 5\' 11"  (1.803 m)   Wt (!) 164.2 kg (362 lb)   SpO2 99%   BMI 50.49 kg/m   Physical Exam  Constitutional: He is oriented to person, place, and time. He appears well-developed and well-nourished. No distress.  Obese  HENT:  Head: Normocephalic and atraumatic.  Nose: Nose normal.  Eyes: Conjunctivae and EOM are normal. Pupils are equal, round, and reactive to light. Right eye exhibits no discharge. Left eye exhibits no discharge. No scleral icterus.  Neck: Normal range of motion. Neck supple.  Cardiovascular: Normal rate and regular rhythm. Exam reveals no gallop and no friction rub.  No murmur heard. Pulmonary/Chest: Effort normal and breath sounds normal. No stridor. No respiratory distress. He has no rales.  Abdominal: Soft. He exhibits no distension. There is no tenderness.    Musculoskeletal: He exhibits no edema or tenderness.  Neurological: He is alert and oriented to person, place, and time.  Skin: Skin is warm and dry. No rash noted. He is not diaphoretic. No erythema.  Psychiatric: He has a normal mood and affect.  Vitals reviewed.     ED Results and Treatments Labs (all labs ordered are listed, but only abnormal results are displayed) Labs Reviewed  CBC WITH DIFFERENTIAL/PLATELET - Abnormal; Notable for the following components:      Result Value   WBC 14.2 (*)    Neutro Abs 10.7 (*)    Monocytes Absolute 1.6 (*)    All other components within normal limits  COMPREHENSIVE METABOLIC PANEL - Abnormal; Notable for the following components:   Potassium 3.4 (*)    Calcium 8.8 (*)    Total Bilirubin 2.1 (*)    All other components within normal limits  CULTURE, BLOOD (ROUTINE X 2)  CULTURE, BLOOD (ROUTINE X 2)  PROTIME-INR  HIV ANTIBODY (ROUTINE TESTING)  CBC  CREATININE, SERUM  BASIC METABOLIC PANEL  CBC  PROTIME-INR  TYPE AND SCREEN  ABO/RH  EKG  EKG Interpretation  Date/Time:    Ventricular Rate:    PR Interval:    QRS Duration:   QT Interval:    QTC Calculation:   R Axis:     Text Interpretation:        Radiology No results found. Pertinent labs & imaging results that were available during my care of the patient were reviewed by me and considered in my medical decision making (see chart for details).  Medications Ordered in ED Medications  piperacillin-tazobactam (ZOSYN) IVPB 3.375 g (not administered)  hydrALAZINE (APRESOLINE) injection 10 mg (not administered)  atenolol (TENORMIN) tablet 100 mg (not administered)  hydrochlorothiazide (HYDRODIURIL) tablet 25 mg (not administered)  naproxen sodium (ALEVE) tablet 440 mg (not administered)  heparin injection 5,000 Units (not administered)  acetaminophen (TYLENOL) tablet 650 mg (not administered)    Or  acetaminophen (TYLENOL) suppository 650 mg (not administered)  ondansetron (ZOFRAN) tablet 4 mg (not administered)    Or  ondansetron (ZOFRAN) injection 4 mg (not administered)  vancomycin (VANCOCIN) IVPB 1000 mg/200 mL premix (not administered)  lactated ringers bolus 1,000 mL (not administered)  lip balm (CARMEX) ointment 1 application (not administered)  magic mouthwash (not administered)  guaiFENesin-dextromethorphan (ROBITUSSIN DM) 100-10 MG/5ML syrup 10 mL (not administered)  hydrocortisone (ANUSOL-HC) 2.5 % rectal cream 1 application (not administered)  alum & mag hydroxide-simeth (MAALOX/MYLANTA) 200-200-20 MG/5ML suspension 30 mL (not administered)  hydrocortisone cream 1 % 1 application (not administered)  menthol-cetylpyridinium (CEPACOL) lozenge 3 mg (not administered)  phenol (CHLORASEPTIC) mouth spray 1-2 spray (not administered)  diphenhydrAMINE (BENADRYL) injection 12.5-25 mg (not administered)  diphenhydrAMINE (BENADRYL) capsule 25 mg (not administered)    HYDROmorphone (DILAUDID) injection 1 mg (not administered)  lidocaine (XYLOCAINE) 2 % injection 0-20 mL (not administered)  0.9 %  sodium chloride infusion ( Intravenous New Bag/Given 06/04/17 1311)  piperacillin-tazobactam (ZOSYN) IVPB 3.375 g (0 g Intravenous Stopped 06/04/17 1531)                                                                                                                                    Procedures Procedures  (including critical care time)  Medical Decision Making / ED Course I have reviewed the nursing notes for this encounter and the patient's prior records (if available in EHR or on provided paperwork).    Recurrent abdominal wall abscess.  Labs with leukocytosis.  Otherwise grossly reassuring.  Started on empiric antibiotics.  Discussed case with medicine.  Dr. Michaell Cowing is already aware and will see the patient in the morning.  Final Clinical Impression(s) / ED Diagnoses Final diagnoses:  Abscess      This chart was dictated using voice recognition software.  Despite best efforts to proofread,  errors can occur which can change the documentation meaning.   Nira Conn, MD 06/04/17 [phone]

## 2017-06-04 NOTE — ED Triage Notes (Signed)
Pt complains of redness, warmth and swelling to this RLQ at the site of an abscess that was previously drained. Wound closed up 2 days ago. Pt was told by Dr. Michaell CowingGross to come to ED and to probably be admitted.

## 2017-06-04 NOTE — H&P (Signed)
[facility] History and Physical  Thomas [name] ZOX:[id number] DOB: 26-Apr-1995 DOA: 06/04/2017  Referring physician:  PCP: Iva Boop, MD   Chief Complaint: "I have another abscess."  HPI: Thomas [name] is a 22 y.o. male patient recently had drainage of an abdominal abscess.  Closed up 2 days ago.  Drainage had ceased for some time.  Pain is has increasing redness and pain in the area since it closed up.  No recent antibiotics.  Patient had his surgeon contacted who advised emergency room evaluation. Denies chills and sob.  ED Course: Started on Zosyn.  Hospitalist consulted for admission.   Review of Systems:  As per HPI otherwise 10 point review of systems negative.    Past Medical History:  Diagnosis Date  . Hypertension   . Spina bifida [facility])    Past Surgical History:  Procedure Laterality Date  . ADENOIDECTOMY    . BACK SURGERY    . BLADDER SURGERY    . FOOT SURGERY    . FRACTURE SURGERY    . PORT-A-CATH REMOVAL    . STOMACH SURGERY    . TONSILLECTOMY     Social History:  reports that  has never smoked. he has never used smokeless tobacco. He reports that he does not drink alcohol or use drugs.  Allergies  Allergen Reactions  . Codeine Rash  . Latex Rash and Swelling    Other reaction(s): Other (See Comments) Other Reaction: mouth swells   . Lorazepam Other (See Comments)    Hallucinations Other reaction(s): Hallucinations, Other (See Comments) Other Reaction: Hallucinations   . Sulfa Antibiotics Rash  . Vancomycin     Other reaction(s): Other (See Comments) Other Reaction: Redman's syndrome  . Food     Tropical fruit  . Ciprofloxacin Nausea Only    Other reaction(s): Other (See Comments), Vomiting chills    Family History  Problem Relation Age of Onset  . Asthma Other   . Cancer Other   . COPD Other   . Hyperlipidemia Other   . Hypertension Other   . Stroke Other      Prior to Admission medications   Medication Sig Start Date  End Date Taking? Authorizing Provider  acetaminophen (TYLENOL) 500 MG tablet Take 1,000 mg by mouth every 6 (six) hours as needed for mild pain.   Yes [provider]  atenolol (TENORMIN) 100 MG tablet Take 100 mg by mouth daily. 02/02/17  Yes [provider]  hydrochlorothiazide (HYDRODIURIL) 25 MG tablet Take 25 mg by mouth every morning. 02/02/17  Yes [provider]  naproxen sodium (ALEVE) 220 MG tablet Take 440 mg by mouth 2 (two) times daily as needed (pain).   Yes [provider]  cefpodoxime (VANTIN) 200 MG tablet Take 1 tablet (200 mg total) by mouth every 12 (twelve) hours. Patient not taking: Reported on 06/04/2017 04/24/17   Marinda Elk, MD   Physical Exam: Vitals:   06/04/17 1202 06/04/17 1210 06/04/17 1529  BP:  (!) 172/101 138/87  Pulse: 73  68  Resp: 18  16  Temp: 98.4 F (36.9 C)    TempSrc: Oral    SpO2: 93%  99%  Weight: (!) 164.2 kg (362 lb)    Height: 5\' 11"  (1.803 m)      Wt Readings from Last 3 Encounters:  06/04/17 (!) 164.2 kg (362 lb)  04/20/17 (!) 163.3 kg (360 lb)  01/25/13 (!) 151 kg (332 lb 14.3 oz) (>99 %, Z= 3.39)*   *  Growth percentiles are based on CDC (Boys, 2-20 Years) data.    General:  Appears calm and comfortable; A&Ox3 Eyes:  PERRL, EOMI, normal lids, iris ENT:  grossly normal hearing, lips & tongue Neck:  no LAD, masses or thyromegaly Cardiovascular:  RRR, no m/r/g. No LE edema.  Respiratory:  CTA bilaterally, no w/r/r. Normal respiratory effort. Abdomen:  soft, ntnd Skin:  no rash or induration seen on limited exam; see image (area Musculoskeletal:  grossly normal tone BUE/BLE Psychiatric:  grossly normal mood and affect, speech fluent and appropriate Neurologic:  CN 2-12 grossly intact, moves all extremities in coordinated fashion.            Labs on Admission:  Basic Metabolic Panel: Recent Labs  Lab 06/04/17 1231  NA 138  K 3.4*  CL 103  CO2 26  GLUCOSE 84  BUN 11  CREATININE  0.83  CALCIUM 8.8*   Liver Function Tests: Recent Labs  Lab 06/04/17 1231  AST 32  ALT 43  ALKPHOS 84  BILITOT 2.1*  PROT 7.5  ALBUMIN 3.5   No results for input(s): LIPASE, AMYLASE in the last 168 hours. No results for input(s): AMMONIA in the last 168 hours. CBC: Recent Labs  Lab 06/04/17 1231  WBC 14.2*  NEUTROABS 10.7*  HGB 14.2  HCT 44.0  MCV 84.9  PLT 311   Cardiac Enzymes: No results for input(s): CKTOTAL, CKMB, CKMBINDEX, TROPONINI in the last 168 hours.  BNP (last 3 results) No results for input(s): BNP in the last 8760 hours.  ProBNP (last 3 results) No results for input(s): PROBNP in the last 8760 hours.   Serum creatinine: 0.83 mg/dL 95/62/1301/06/20 [id number] Estimated creatinine clearance: 220.8 mL/min  CBG: No results for input(s): GLUCAP in the last 168 hours.  Radiological Exams on Admission: No results found.  EKG: pending  Assessment/Plan Principal Problem:   Abscess Active Problems:   Hypertension, renal   Chronic leg pain   Abscess Check abd US cancelled per gen surg, will get CT Zosyn & Vanc due to MDRO hx Dr. Michaell CowingGross will see pt in AM per EDP  Hypertension When necessary hydralazine 10 mg IV as needed for severe blood pressure Cont tenormin and hctz  Chronic leg pain Prn aleve  Code Status: FC  DVT Prophylaxis: Heparin Family Communication: MOP at bedside Disposition Plan: Pending Improvement  Status: obs medsurg  Haydee SalterPhillip M Hobbs, MD Family Medicine [facility] [email or web address] Password [email or web address]

## 2017-06-04 NOTE — ED Notes (Signed)
ED TO INPATIENT HANDOFF REPORT  Name/Age/Gender Thomas [name] 22 y.o. male  Code Status    Code Status Orders  (From admission, onward)        Start     Ordered   06/04/17 1718  Full code  Continuous     06/04/17 1719    Code Status History    Date Active Date Inactive Code Status Order ID Comments User Context   04/20/2017 01:07 04/24/2017 17:51 Full Code [id number]  Norval Morton, MD ED      Home/SNF/Other Home  Chief Complaint POST OP ISSUES / FEVER   Level of Care/Admitting Diagnosis ED Disposition    ED Disposition Condition [facility] Area: [facility] [[id number]]  Level of Care: Med-Surg [16]  Diagnosis: Abscess [503888]  Admitting Physician: Elwin Mocha [[id number]]  Attending Physician: Aggie Moats, Layne Benton [[id number]]  PT Class (Do Not Modify): Observation [104]  PT Acc Code (Do Not Modify): Observation [10022]       Medical History Past Medical History:  Diagnosis Date  . Hypertension   . Spina bifida ([place])     Allergies Allergies  Allergen Reactions  . Codeine Rash  . Latex Rash and Swelling    Other reaction(s): Other (See Comments) Other Reaction: mouth swells   . Lorazepam Other (See Comments)    Hallucinations Other reaction(s): Hallucinations, Other (See Comments) Other Reaction: Hallucinations   . Sulfa Antibiotics Rash  . Vancomycin Other (See Comments)    Redman's syndrome  . Food     Tropical fruit  . Ciprofloxacin Nausea Only    Other reaction(s): Other (See Comments), Vomiting chills    IV Location/Drains/Wounds Patient Lines/Drains/Airways Status   Active Line/Drains/Airways    Name:   Placement date:   Placement time:   Site:   Days:   Peripheral IV 06/04/17 Left;Upper Arm   06/04/17    1258    Arm   less than 1   Wound / Incision (Open or Dehisced) 04/20/17 Incision - Open Abdomen Lower;Medial;Right bedside I&D this am by CCS   04/20/17    1117    Abdomen   45           Labs/Imaging Results for orders placed or performed during the hospital encounter of 06/04/17 (from the past 48 hour(s))  CBC with Differential/Platelet     Status: Abnormal   Collection Time: 06/04/17 12:31 PM  Result Value Ref Range   WBC 14.2 (H) 4.0 - 10.5 K/uL   RBC 5.18 4.22 - 5.81 MIL/uL   Hemoglobin 14.2 13.0 - 17.0 g/dL   HCT 44.0 39.0 - 52.0 %   MCV 84.9 78.0 - 100.0 fL   MCH 27.4 26.0 - 34.0 pg   MCHC 32.3 30.0 - 36.0 g/dL   RDW 15.1 11.5 - 15.5 %   Platelets 311 150 - 400 K/uL   Neutrophils Relative % 75 %   Neutro Abs 10.7 (H) 1.7 - 7.7 K/uL   Lymphocytes Relative 13 %   Lymphs Abs 1.8 0.7 - 4.0 K/uL   Monocytes Relative 11 %   Monocytes Absolute 1.6 (H) 0.1 - 1.0 K/uL   Eosinophils Relative 1 %   Eosinophils Absolute 0.1 0.0 - 0.7 K/uL   Basophils Relative 0 %   Basophils Absolute 0.0 0.0 - 0.1 K/uL    Comment: Performed at [facility], [place] [address], [place], [place] [address]  Comprehensive metabolic panel     Status:  Abnormal   Collection Time: 06/04/17 12:31 PM  Result Value Ref Range   Sodium 138 135 - 145 mmol/L   Potassium 3.4 (L) 3.5 - 5.1 mmol/L   Chloride 103 101 - 111 mmol/L   CO2 26 22 - 32 mmol/L   Glucose, Bld 84 65 - 99 mg/dL   BUN 11 6 - 20 mg/dL   Creatinine, Ser 0.83 0.61 - 1.24 mg/dL   Calcium 8.8 (L) 8.9 - 10.3 mg/dL   Total Protein 7.5 6.5 - 8.1 g/dL   Albumin 3.5 3.5 - 5.0 g/dL   AST 32 15 - 41 U/L   ALT 43 17 - 63 U/L   Alkaline Phosphatase 84 38 - 126 U/L   Total Bilirubin 2.1 (H) 0.3 - 1.2 mg/dL   GFR calc non Af Amer >60 >60 mL/min   GFR calc Af Amer >60 >60 mL/min    Comment: (NOTE) The eGFR has been calculated using the CKD EPI equation. This calculation has not been validated in all clinical situations. eGFR's persistently <60 mL/min signify possible Chronic Kidney Disease.    Anion gap 9 5 - 15    Comment: Performed at [facility], [place] [address], [place], [place] [address]   Type and screen [place]     Status: None   Collection Time: 06/04/17  1:17 PM  Result Value Ref Range   ABO/RH(D) O POS    Antibody Screen NEG    Sample Expiration      06/07/2017 Performed at [facility], [place] [address], [place], [place] [address]   Protime-INR     Status: None   Collection Time: 06/04/17  2:00 PM  Result Value Ref Range   Prothrombin Time 14.0 11.4 - 15.2 seconds   INR 1.09     Comment: Performed at [facility], [place] [address] [place], [place] [address]   No results found.  Pending Labs Unresulted Labs (From admission, onward)   Start     Ordered   06/05/17 3893  Basic metabolic panel  Tomorrow morning,   R     06/04/17 1719   06/05/17 0500  CBC  Tomorrow morning,   R     06/04/17 1719   06/05/17 0500  Protime-INR  Tomorrow morning,   R     06/04/17 1719   06/04/17 1717  CBC  (heparin)  Once,   R    Comments:  Baseline for heparin therapy IF NOT ALREADY DRAWN.  Notify MD if PLT < 100 K.    06/04/17 1719   06/04/17 1717  Creatinine, serum  (heparin)  Once,   R    Comments:  Baseline for heparin therapy IF NOT ALREADY DRAWN.    06/04/17 1719   06/04/17 1716  HIV antibody (Routine Testing)  Once,   R     06/04/17 1719   06/04/17 1232  Blood culture (routine x 2)  BLOOD CULTURE X 2,   STAT     06/04/17 1231   06/04/17 1230  ABO/Rh  Once,   R     06/04/17 1230      Vitals/Pain Today's Vitals   06/04/17 1202 06/04/17 1210 06/04/17 1529  BP:  (!) 172/101 138/87  Pulse: 73  68  Resp: 18  16  Temp: 98.4 F (36.9 C)    TempSrc: Oral    SpO2: 93%  99%  Weight: (!) 362 lb (164.2 kg)    Height: '5\' 11"'  (  1.803 m)      Isolation Precautions No active isolations  Medications Medications  piperacillin-tazobactam (ZOSYN) IVPB 3.375 g (not administered)  hydrALAZINE (APRESOLINE) injection 10 mg (not administered)  atenolol (TENORMIN) tablet 100 mg (not administered)  hydrochlorothiazide  (HYDRODIURIL) tablet 25 mg (not administered)  naproxen sodium (ALEVE) tablet 440 mg (not administered)  heparin injection 5,000 Units (not administered)  acetaminophen (TYLENOL) tablet 650 mg (not administered)    Or  acetaminophen (TYLENOL) suppository 650 mg (not administered)  ondansetron (ZOFRAN) tablet 4 mg (not administered)    Or  ondansetron (ZOFRAN) injection 4 mg (not administered)  vancomycin (VANCOCIN) IVPB 1000 mg/200 mL premix (not administered)  lactated ringers bolus 1,000 mL (not administered)  lip balm (CARMEX) ointment 1 application (not administered)  magic mouthwash (not administered)  guaiFENesin-dextromethorphan (ROBITUSSIN DM) 100-10 MG/5ML syrup 10 mL (not administered)  hydrocortisone (ANUSOL-HC) 2.5 % rectal cream 1 application (not administered)  alum & mag hydroxide-simeth (MAALOX/MYLANTA) 200-200-20 MG/5ML suspension 30 mL (not administered)  hydrocortisone cream 1 % 1 application (not administered)  menthol-cetylpyridinium (CEPACOL) lozenge 3 mg (not administered)  phenol (CHLORASEPTIC) mouth spray 1-2 spray (not administered)  diphenhydrAMINE (BENADRYL) injection 12.5-25 mg (not administered)  diphenhydrAMINE (BENADRYL) capsule 25 mg (not administered)  HYDROmorphone (DILAUDID) injection 1 mg (not administered)  lidocaine (XYLOCAINE) 2 % injection 0-20 mL (not administered)  0.9 %  sodium chloride infusion ( Intravenous New Bag/Given 06/04/17 1311)  piperacillin-tazobactam (ZOSYN) IVPB 3.375 g (0 g Intravenous Stopped 06/04/17 1531)    Mobility walks

## 2017-06-04 NOTE — Telephone Encounter (Signed)
Thomas PasserChristopher C [name]  23-Aug-1995 [id number]  Patient Care Team: ViaCaryn Bee, Kevin, MD as PCP - General (Family Medicine)  This patient is a 22 y.o.male who calls today for surgical evaluation.   Date of procedure/visit: 04/20/2017  Surgery: I&D abd wall abscess  Reason for call: Wound closed and worsening redness  Called by patient's mother.  Morbidly obese male with spina bifida and multi-drug-resistant infections.  Came in with cellulitis.  Admitted to medicine.  Surgery consulted.  Incision and drainage of abdominal abscess done.  Improved.  Followed up in clinic for wound checks and packing supervision.  Due for 2 weeks follow-up in [facility] office tomorrow.  Patient's mother notes the RLQ abd wall wound fully closed down about 4 days ago.  Began to get red 2 days ago.  Now the redness is "as big as a dinner plate."  Sounds like he has recurrent cellulitis.  Possible abscess.  I recommend he come to the emergency room since his redness is rapidly worsened.  Most likely medical admission given his numerous medical complexity.  See if antibiotics is all he needs.  Perhaps surgical reconsultation if there is a deeper fluid collection.  She most likely will come to [facility]  Patient Active Problem List   Diagnosis Date Noted  . Spina bifida (HCC) 04/20/2017    Priority: High  . Morbid obesity with BMI of 50.0-59.9, adult (HCC) 04/22/2017    Priority: Medium  . 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 Abscess of abdominal wall s/p I&D 04/20/2017 04/22/2017  . Cellulitis and abscess of trunk 04/20/2017  . Leukocytosis 04/20/2017  . Neurogenic bladder 04/20/2017  . Essential hypertension with goal blood pressure less than 130/85 04/20/2017  . Hypokalemia 04/20/2017  . Snoring 05/21/2015  . Hypertension, renal 05/28/2013  . Neurogenic bowel, not elsewhere classified 01/06/2012  . Neuromuscular dysfunction of bladder 01/06/2012    Past Medical History:  Diagnosis Date   . Hypertension   . Spina bifida [facility](HCC)     Past Surgical History:  Procedure Laterality Date  . ADENOIDECTOMY    . BACK SURGERY    . BLADDER SURGERY    . FOOT SURGERY    . FRACTURE SURGERY    . PORT-A-CATH REMOVAL    . STOMACH SURGERY    . TONSILLECTOMY      Social History   Socioeconomic History  . Marital status: Single    Spouse name: Not on file  . Number of children: Not on file  . Years of education: Not on file  . Highest education level: Not on file  Social Needs  . Financial resource strain: Not on file  . Food insecurity - worry: Not on file  . Food insecurity - inability: Not on file  . Transportation needs - medical: Not on file  . Transportation needs - non-medical: Not on file  Occupational History  . Not on file  Tobacco Use  . Smoking status: Never Smoker  . Smokeless tobacco: Never Used  Substance and Sexual Activity  . Alcohol use: No  . Drug use: No  . Sexual activity: Not on file  Other Topics Concern  . Not on file  Social History Narrative  . Not on file    Family History  Problem Relation Age of Onset  . Asthma Other   . Cancer Other   . COPD Other   . Hyperlipidemia Other   . Hypertension Other   . Stroke Other     Current Outpatient  Medications  Medication Sig Dispense Refill  . atenolol (TENORMIN) 100 MG tablet Take 100 mg by mouth daily.  3  . cefpodoxime (VANTIN) 200 MG tablet Take 1 tablet (200 mg total) by mouth every 12 (twelve) hours. 10 tablet 0  . hydrochlorothiazide (HYDRODIURIL) 25 MG tablet Take 25 mg by mouth every morning.  3  . ibuprofen (ADVIL,MOTRIN) 200 MG tablet Take 400 mg by mouth every 6 (six) hours as needed. Patient used this medication for his headache.     No current facility-administered medications for this visit.      Allergies  Allergen Reactions  . Codeine Rash  . Latex Rash and Swelling    Other reaction(s): Other (See Comments) Other Reaction: mouth swells   . Lorazepam Other (See  Comments)    Hallucinations Other reaction(s): Hallucinations, Other (See Comments) Other Reaction: Hallucinations   . Sulfa Antibiotics Rash  . Vancomycin     Other reaction(s): Other (See Comments) Other Reaction: Redman's syndrome  . Food     Tropical fruit  . Ciprofloxacin Nausea Only    Other reaction(s): Other (See Comments), Vomiting chills    @VS @  No results found.  Note: This dictation was prepared with Dragon/digital dictation along with [employer]. Any transcriptional errors that result from this process are unintentional.   .Ardeth Sportsman, M.D., F.A.C.S. Gastrointestinal and Minimally Invasive Surgery [facility] [address] [place], [place] [address] [phone] Main / Paging  06/04/2017 10:47 AM

## 2017-06-04 NOTE — ED Notes (Signed)
Pt called stating wound was opening, upon arriving large amount of tan puss draining, total of 6 abd pads, paper towel and suction when Dr Erma HeritageIsaacs arrived. After clearing area ABD dressing and paper tape applied.

## 2017-06-04 NOTE — ED Notes (Signed)
Waiting for admission room

## 2017-06-05 ENCOUNTER — Encounter (HOSPITAL_COMMUNITY): Payer: Self-pay | Admitting: Emergency Medicine

## 2017-06-05 ENCOUNTER — Observation Stay (HOSPITAL_COMMUNITY): Payer: 59 | Admitting: Anesthesiology

## 2017-06-05 ENCOUNTER — Encounter (HOSPITAL_COMMUNITY)
Admission: EM | Disposition: A | Discharge status: Home / Self Care | Payer: Self-pay | Source: Home / Self Care | Attending: Emergency Medicine

## 2017-06-05 DIAGNOSIS — I10 Essential (primary) hypertension: Secondary | ICD-10-CM | POA: Diagnosis not present

## 2017-06-05 DIAGNOSIS — L02211 Cutaneous abscess of abdominal wall: Secondary | ICD-10-CM | POA: Diagnosis not present

## 2017-06-05 DIAGNOSIS — L03319 Cellulitis of trunk, unspecified: Secondary | ICD-10-CM | POA: Diagnosis not present

## 2017-06-05 DIAGNOSIS — D72829 Elevated white blood cell count, unspecified: Secondary | ICD-10-CM | POA: Diagnosis not present

## 2017-06-05 DIAGNOSIS — E876 Hypokalemia: Secondary | ICD-10-CM | POA: Diagnosis not present

## 2017-06-05 DIAGNOSIS — L0291 Cutaneous abscess, unspecified: Secondary | ICD-10-CM | POA: Diagnosis not present

## 2017-06-05 DIAGNOSIS — L02219 Cutaneous abscess of trunk, unspecified: Secondary | ICD-10-CM | POA: Diagnosis not present

## 2017-06-05 DIAGNOSIS — Z221 Carrier of other intestinal infectious diseases: Secondary | ICD-10-CM | POA: Diagnosis not present

## 2017-06-05 DIAGNOSIS — I129 Hypertensive chronic kidney disease with stage 1 through stage 4 chronic kidney disease, or unspecified chronic kidney disease: Secondary | ICD-10-CM | POA: Diagnosis not present

## 2017-06-05 DIAGNOSIS — M79604 Pain in right leg: Secondary | ICD-10-CM

## 2017-06-05 DIAGNOSIS — Q059 Spina bifida, unspecified: Secondary | ICD-10-CM

## 2017-06-05 DIAGNOSIS — N319 Neuromuscular dysfunction of bladder, unspecified: Secondary | ICD-10-CM

## 2017-06-05 DIAGNOSIS — M79605 Pain in left leg: Secondary | ICD-10-CM | POA: Diagnosis not present

## 2017-06-05 DIAGNOSIS — G8929 Other chronic pain: Secondary | ICD-10-CM

## 2017-06-05 DIAGNOSIS — Z6841 Body Mass Index (BMI) 40.0 and over, adult: Secondary | ICD-10-CM

## 2017-06-05 HISTORY — PX: IRRIGATION AND DEBRIDEMENT ABSCESS: SHX5252

## 2017-06-05 LAB — BASIC METABOLIC PANEL
ANION GAP: 9 (ref 5–15)
BUN: 10 mg/dL (ref 6–20)
CO2: 29 mmol/L (ref 22–32)
Calcium: 8.5 mg/dL — ABNORMAL LOW (ref 8.9–10.3)
Chloride: 102 mmol/L (ref 101–111)
Creatinine, Ser: 0.95 mg/dL (ref 0.61–1.24)
GFR calc Af Amer: >60 mL/min (ref >60)
GFR calc non Af Amer: >60 mL/min (ref >60)
Glucose, Bld: 110 mg/dL — ABNORMAL HIGH (ref 65–99)
POTASSIUM: 3.2 mmol/L — AB (ref 3.5–5.1)
SODIUM: 140 mmol/L (ref 135–145)

## 2017-06-05 LAB — PROTIME-INR
INR: 1.02
Prothrombin Time: 13.3 seconds (ref 11.4–15.2)

## 2017-06-05 LAB — ABO/RH: ABO/RH(D): O POS

## 2017-06-05 LAB — CBC
HEMATOCRIT: 42.6 % (ref 39.0–52.0)
Hemoglobin: 13.8 g/dL (ref 13.0–17.0)
MCH: 27.4 pg (ref 26.0–34.0)
MCHC: 32.4 g/dL (ref 30.0–36.0)
MCV: 84.7 fL (ref 78.0–100.0)
Platelets: 316 10*3/uL (ref 150–400)
RBC: 5.03 MIL/uL (ref 4.22–5.81)
RDW: 15.4 % (ref 11.5–15.5)
WBC: 11.7 10*3/uL — AB (ref 4.0–10.5)

## 2017-06-05 LAB — HIV ANTIBODY (ROUTINE TESTING W REFLEX): HIV SCREEN 4TH GENERATION: NONREACTIVE

## 2017-06-05 LAB — MRSA PCR SCREENING: MRSA BY PCR: NEGATIVE

## 2017-06-05 SURGERY — IRRIGATION AND DEBRIDEMENT ABSCESS
Anesthesia: General

## 2017-06-05 MED ORDER — HEPARIN SODIUM (PORCINE) 5000 UNIT/ML IJ SOLN: 5000.0000 [IU] | Freq: Three times a day (TID) | INTRAMUSCULAR | Status: DC

## 2017-06-05 MED ORDER — LIDOCAINE HCL (PF) 1 % IJ SOLN
INTRAMUSCULAR | Status: DC | PRN
  Administered 2017-06-05: 40 mL

## 2017-06-05 MED ORDER — DEXAMETHASONE SODIUM PHOSPHATE 10 MG/ML IJ SOLN
INTRAMUSCULAR | Status: AC
  Filled 2017-06-05: qty 1

## 2017-06-05 MED ORDER — CHLORHEXIDINE GLUCONATE CLOTH 2 % EX PADS
6.0000 | MEDICATED_PAD | Freq: Once | CUTANEOUS | Status: AC
  Administered 2017-06-05: 6 via TOPICAL

## 2017-06-05 MED ORDER — PROPOFOL 10 MG/ML IV BOLUS
INTRAVENOUS | Status: DC | PRN
  Administered 2017-06-05: 50 mg via INTRAVENOUS
  Administered 2017-06-05: 300 mg via INTRAVENOUS

## 2017-06-05 MED ORDER — LIDOCAINE 2% (20 MG/ML) 5 ML SYRINGE
INTRAMUSCULAR | Status: AC
  Filled 2017-06-05: qty 5

## 2017-06-05 MED ORDER — BUPIVACAINE-EPINEPHRINE (PF) 0.5% -1:200000 IJ SOLN
INTRAMUSCULAR | Status: AC
  Filled 2017-06-05: qty 30

## 2017-06-05 MED ORDER — PROMETHAZINE HCL 25 MG/ML IJ SOLN: 6.2500 mg | INTRAMUSCULAR | Status: DC | PRN

## 2017-06-05 MED ORDER — MAGNESIUM SULFATE 2 GM/50ML IV SOLN
2.0000 g | Freq: Once | INTRAVENOUS | Status: AC
  Administered 2017-06-05: 2 g via INTRAVENOUS
  Filled 2017-06-05: qty 50

## 2017-06-05 MED ORDER — LACTATED RINGERS IV SOLN
INTRAVENOUS | Status: DC
  Administered 2017-06-05: 12:00:00 via INTRAVENOUS

## 2017-06-05 MED ORDER — LIDOCAINE 2% (20 MG/ML) 5 ML SYRINGE
INTRAMUSCULAR | Status: DC | PRN
  Administered 2017-06-05: 80 mg via INTRAVENOUS

## 2017-06-05 MED ORDER — SUCCINYLCHOLINE CHLORIDE 200 MG/10ML IV SOSY
PREFILLED_SYRINGE | INTRAVENOUS | Status: AC
  Filled 2017-06-05: qty 10

## 2017-06-05 MED ORDER — SCOPOLAMINE 1 MG/3DAYS TD PT72
1.0000 | MEDICATED_PATCH | TRANSDERMAL | Status: DC
  Administered 2017-06-05: 1.5 mg via TRANSDERMAL
  Filled 2017-06-05: qty 1

## 2017-06-05 MED ORDER — HEPARIN SODIUM (PORCINE) 5000 UNIT/ML IJ SOLN
5000.0000 [IU] | Freq: Three times a day (TID) | INTRAMUSCULAR | Status: DC
  Administered 2017-06-05 – 2017-06-07 (×5): 5000 [IU] via SUBCUTANEOUS
  Filled 2017-06-05 (×5): qty 1

## 2017-06-05 MED ORDER — PROPOFOL 10 MG/ML IV BOLUS
INTRAVENOUS | Status: AC
  Filled 2017-06-05: qty 20

## 2017-06-05 MED ORDER — KETOROLAC TROMETHAMINE 30 MG/ML IJ SOLN: 30.0000 mg | Freq: Once | INTRAMUSCULAR | Status: DC | PRN

## 2017-06-05 MED ORDER — MIDAZOLAM HCL 5 MG/5ML IJ SOLN
INTRAMUSCULAR | Status: DC | PRN
  Administered 2017-06-05: 2 mg via INTRAVENOUS

## 2017-06-05 MED ORDER — MIDAZOLAM HCL 2 MG/2ML IJ SOLN
INTRAMUSCULAR | Status: AC
  Filled 2017-06-05: qty 2

## 2017-06-05 MED ORDER — BUPIVACAINE-EPINEPHRINE (PF) 0.5% -1:200000 IJ SOLN
INTRAMUSCULAR | Status: DC | PRN
  Administered 2017-06-05: 20 mL

## 2017-06-05 MED ORDER — HYDROMORPHONE HCL 1 MG/ML IJ SOLN: 0.2500 mg | INTRAMUSCULAR | Status: DC | PRN

## 2017-06-05 MED ORDER — DEXAMETHASONE SODIUM PHOSPHATE 10 MG/ML IJ SOLN
INTRAMUSCULAR | Status: DC | PRN
  Administered 2017-06-05: 10 mg via INTRAVENOUS

## 2017-06-05 MED ORDER — LIDOCAINE HCL (PF) 1 % IJ SOLN
INTRAMUSCULAR | Status: AC
  Filled 2017-06-05: qty 30

## 2017-06-05 MED ORDER — SODIUM CHLORIDE 0.9 % IR SOLN
Status: DC | PRN
  Administered 2017-06-05: 1

## 2017-06-05 MED ORDER — SUCCINYLCHOLINE CHLORIDE 200 MG/10ML IV SOSY
PREFILLED_SYRINGE | INTRAVENOUS | Status: DC | PRN
  Administered 2017-06-05: 160 mg via INTRAVENOUS

## 2017-06-05 MED ORDER — FENTANYL CITRATE (PF) 250 MCG/5ML IJ SOLN
INTRAMUSCULAR | Status: AC
  Filled 2017-06-05: qty 5

## 2017-06-05 MED ORDER — FENTANYL CITRATE (PF) 250 MCG/5ML IJ SOLN
INTRAMUSCULAR | Status: DC | PRN
  Administered 2017-06-05: 100 ug via INTRAVENOUS
  Administered 2017-06-05: 50 ug via INTRAVENOUS

## 2017-06-05 MED ORDER — ONDANSETRON HCL 4 MG/2ML IJ SOLN
INTRAMUSCULAR | Status: AC
  Filled 2017-06-05: qty 2

## 2017-06-05 MED ORDER — POTASSIUM CHLORIDE CRYS ER 20 MEQ PO TBCR
40.0000 meq | EXTENDED_RELEASE_TABLET | Freq: Three times a day (TID) | ORAL | Status: AC
  Administered 2017-06-05 (×2): 40 meq via ORAL
  Filled 2017-06-05 (×2): qty 2

## 2017-06-05 SURGICAL SUPPLY — 17 items
BNDG GAUZE ELAST 4 BULKY (GAUZE/BANDAGES/DRESSINGS) ×2 IMPLANT
COVER SURGICAL LIGHT HANDLE (MISCELLANEOUS) ×2 IMPLANT
GAUZE SPONGE 4X4 12PLY STRL (GAUZE/BANDAGES/DRESSINGS) ×2 IMPLANT
GLOVE BIO SURGEON STRL SZ 6 (GLOVE) ×2 IMPLANT
GLOVE BIOGEL PI IND STRL 6.5 (GLOVE) ×1 IMPLANT
GLOVE BIOGEL PI IND STRL 7.0 (GLOVE) ×1 IMPLANT
GLOVE BIOGEL PI INDICATOR 6.5 (GLOVE) ×1
GLOVE BIOGEL PI INDICATOR 7.0 (GLOVE) ×1
GLOVE INDICATOR 6.5 STRL GRN (GLOVE) ×2 IMPLANT
GOWN STRL REUS W/ TWL XL LVL3 (GOWN DISPOSABLE) ×3 IMPLANT
GOWN STRL REUS W/TWL 2XL LVL3 (GOWN DISPOSABLE) ×2 IMPLANT
GOWN STRL REUS W/TWL XL LVL3 (GOWN DISPOSABLE) ×3
HANDPIECE INTERPULSE COAX TIP (DISPOSABLE) ×1
PACK BASIC (CUSTOM PROCEDURE TRAY) ×2 IMPLANT
PAD ABD 8X10 STRL (GAUZE/BANDAGES/DRESSINGS) ×2 IMPLANT
SET HNDPC FAN SPRY TIP SCT (DISPOSABLE) ×1 IMPLANT
TOWEL OR 17X26 10 PK STRL BLUE (TOWEL DISPOSABLE) ×2 IMPLANT

## 2017-06-05 NOTE — Anesthesia Procedure Notes (Signed)
Procedure Name: Intubation Date/Time: 06/05/2017 1:44 PM Performed by: Talbot Grumbling, CRNA Pre-anesthesia Checklist: Emergency Drugs available, Suction available, Patient being monitored and Patient identified Patient Re-evaluated:Patient Re-evaluated prior to induction Oxygen Delivery Method: Circle system utilized Preoxygenation: Pre-oxygenation with 100% oxygen Induction Type: IV induction Ventilation: Mask ventilation without difficulty Laryngoscope Size: Mac and 4 Grade View: Grade I Tube type: Oral Tube size: 8.0 mm Number of attempts: 1 Airway Equipment and Method: Stylet Placement Confirmation: ETT inserted through vocal cords under direct vision,  positive ETCO2 and breath sounds checked- equal and bilateral Secured at: 23 cm Tube secured with: Tape Dental Injury: Teeth and Oropharynx as per pre-operative assessment

## 2017-06-05 NOTE — Op Note (Signed)
Incision and Drainage right abdominal wall abscess  Pre-operative Diagnosis: right abdominal wall abscess   Post-operative Diagnosis: same   Indications: Pt developed recurrent right abdominal wall abscess.  This is in site of former appendicostomy tube.  Had some spontaneous drainage, but needs to get washed out and debrided.    Anesthesia: General   Procedure Details  The procedure, risks and complications have been discussed in detail (including, infection, bleeding, need for additional procedures) with the patient, and the patient has signed consent to the procedure. The patient was informed that the wound would be left open.  The patient was taken to OR 4 and general anesthesia was induced. The patient was placed into supine position.  The skin was sterilely prepped and draped over the affected area in the usual fashion. Time out was performed according to the surgical safety checklist.   The opening from spontaneous drainage was cultured.  The core of irritated tissue was debrided sharply down to the anterior abdominal wall.  Skin and fat were excised.  No fascial defect was seen.  Firm tissue with chronic inflammation was removed.   Additional skin was debrided around the opening. Purulent drainage was present. The pulse lavage was used to irrigate the cavity. The wound was packed with Betadine soaked kerlix.  The patient was awakened from anesthesia and taken to the PACU in stable condition.   Findings:  Chronic inflammation and pus  EBL: min cc's   Drains: None   Condition: Tolerated procedure well   Complications:  none known.

## 2017-06-05 NOTE — Consult Note (Signed)
[facility] Surgery Consult Note  Thomas [name] 12/26/1995  [id number].    Requesting MD: [place] Crane Chief Complaint/Reason for Consult: abdominal wall abscess HPI:  Patient is a 22 year old male with a history of I&D of abdominal wall cellulitis and abscess that was treated with bedside I&D in January of 2019. Wound was improving and patient was following up in [place] clinic. Per mother the wound closed 4 days ago and started getting red 2 days ago. Redness was "dinner plate" sized yesterday AM. Wound opened up last night in the ED and drained a large amount of tan pus. CT showed abdominal wall abscess. Since wound has opened and drained skin is less erythematous and wound is less tender. Patient reports that he was having some fevers at night at home, which resolved with aleve. Denies abdominal pain, n/v, diarrhea, constipation.  Patient has a history of appendicostomy with closure of the stoma in 2008 and history of cecostomy tube also in 2008 at [facility] Records located in care everywhere. Family is concerned that he may be having recurrent abdominal wall abscesses due to a connection between the colon and the abdominal wall.     ROS: Review of Systems  Constitutional: Positive for fever. Negative for chills.  Respiratory: Negative for shortness of breath.   Cardiovascular: Negative for chest pain and palpitations.  Gastrointestinal: Negative for abdominal pain, blood in stool, constipation, diarrhea, nausea and vomiting.  Genitourinary: Negative for dysuria and urgency.  All other systems reviewed and are negative.   Family History  Problem Relation Age of Onset  . Asthma Other   . Cancer Other   . COPD Other   . Hyperlipidemia Other   . Hypertension Other   . Stroke Other     Past Medical History:  Diagnosis Date  . Hypertension   . Spina bifida [facility])     Past Surgical History:  Procedure Laterality Date  . ADENOIDECTOMY    . BACK SURGERY    . BLADDER SURGERY    . FOOT  SURGERY    . FRACTURE SURGERY    . PORT-A-CATH REMOVAL    . STOMACH SURGERY    . TONSILLECTOMY      Social History:  reports that  has never smoked. he has never used smokeless tobacco. He reports that he does not drink alcohol or use drugs.  Allergies:  Allergies  Allergen Reactions  . Codeine Rash  . Latex Rash and Swelling    Other reaction(s): Other (See Comments) Other Reaction: mouth swells   . Lorazepam Other (See Comments)    Hallucinations Other reaction(s): Hallucinations, Other (See Comments) Other Reaction: Hallucinations   . Sulfa Antibiotics Rash  . Vancomycin Other (See Comments)    Redman's syndrome  . Food     Tropical fruit  . Ciprofloxacin Nausea Only    Other reaction(s): Other (See Comments), Vomiting chills    Medications Prior to Admission  Medication Sig Dispense Refill  . acetaminophen (TYLENOL) 500 MG tablet Take 1,000 mg by mouth every 6 (six) hours as needed for mild pain.    Marland Kitchen atenolol (TENORMIN) 100 MG tablet Take 100 mg by mouth daily.  3  . hydrochlorothiazide (HYDRODIURIL) 25 MG tablet Take 25 mg by mouth every morning.  3  . naproxen sodium (ALEVE) 220 MG tablet Take 440 mg by mouth 2 (two) times daily as needed (pain).    . cefpodoxime (VANTIN) 200 MG tablet Take 1 tablet (200 mg total) by mouth every 12 (twelve)  hours. (Patient not taking: Reported on 06/04/2017) 10 tablet 0    Blood pressure 134/81, pulse 62, temperature 98.5 F (36.9 C), temperature source Oral, resp. rate 18, height '5\' 11"'  (1.803 m), weight (!) 164.2 kg (362 lb), SpO2 96 %. Physical Exam: Physical Exam  Constitutional: He is oriented to person, place, and time. He appears well-developed. He is cooperative.  Non-toxic appearance. No distress.  HENT:  Head: Normocephalic and atraumatic.  Right Ear: External ear normal.  Left Ear: External ear normal.  Nose: Nose normal.  Mouth/Throat: Oropharynx is clear and moist and mucous membranes are normal.  Eyes:  Conjunctivae and EOM are normal. Pupils are equal, round, and reactive to light. Lids are everted and swept, no foreign bodies found. No scleral icterus.  Neck: Normal range of motion and phonation normal. Neck supple.  Cardiovascular: Normal rate and regular rhythm.  Pulses:      Radial pulses are 2+ on the right side, and 2+ on the left side.       Dorsalis pedis pulses are 2+ on the right side, and 2+ on the left side.  No LE edema bilaterally   Pulmonary/Chest: Effort normal and breath sounds normal.  Abdominal: Soft. He exhibits no distension and no mass. There is no tenderness. There is no rigidity, no rebound and no guarding.  Obese  Musculoskeletal:  ROM grossly intact in BL upper and lower extremities  Neurological: He is alert and oriented to person, place, and time. He has normal strength. No sensory deficit.  Skin: Skin is warm and dry.  Area of erythema on abdomen, mildly TTP, small central opening with purulent-sanguinous drainage  Psychiatric: He has a normal mood and affect. His speech is normal and behavior is normal.    Results for orders placed or performed during the hospital encounter of 06/04/17 (from the past 48 hour(s))  ABO/Rh     Status: None   Collection Time: 06/04/17 12:30 PM  Result Value Ref Range   ABO/RH(D)      O POS Performed at [facility] [address], [place], [place] [address]   CBC with Differential/Platelet     Status: Abnormal   Collection Time: 06/04/17 12:31 PM  Result Value Ref Range   WBC 14.2 (H) 4.0 - 10.5 K/uL   RBC 5.18 4.22 - 5.81 MIL/uL   Hemoglobin 14.2 13.0 - 17.0 g/dL   HCT 44.0 39.0 - 52.0 %   MCV 84.9 78.0 - 100.0 fL   MCH 27.4 26.0 - 34.0 pg   MCHC 32.3 30.0 - 36.0 g/dL   RDW 15.1 11.5 - 15.5 %   Platelets 311 150 - 400 K/uL   Neutrophils Relative % 75 %   Neutro Abs 10.7 (H) 1.7 - 7.7 K/uL   Lymphocytes Relative 13 %   Lymphs Abs 1.8 0.7 - 4.0 K/uL   Monocytes Relative 11 %   Monocytes  Absolute 1.6 (H) 0.1 - 1.0 K/uL   Eosinophils Relative 1 %   Eosinophils Absolute 0.1 0.0 - 0.7 K/uL   Basophils Relative 0 %   Basophils Absolute 0.0 0.0 - 0.1 K/uL    Comment: Performed at [facility], [place] [address], [place], [place] [address]  Comprehensive metabolic panel     Status: Abnormal   Collection Time: 06/04/17 12:31 PM  Result Value Ref Range   Sodium 138 135 - 145 mmol/L   Potassium 3.4 (L) 3.5 - 5.1 mmol/L   Chloride 103 101 - 111  mmol/L   CO2 26 22 - 32 mmol/L   Glucose, Bld 84 65 - 99 mg/dL   BUN 11 6 - 20 mg/dL   Creatinine, Ser 0.83 0.61 - 1.24 mg/dL   Calcium 8.8 (L) 8.9 - 10.3 mg/dL   Total Protein 7.5 6.5 - 8.1 g/dL   Albumin 3.5 3.5 - 5.0 g/dL   AST 32 15 - 41 U/L   ALT 43 17 - 63 U/L   Alkaline Phosphatase 84 38 - 126 U/L   Total Bilirubin 2.1 (H) 0.3 - 1.2 mg/dL   GFR calc non Af Amer >60 >60 mL/min   GFR calc Af Amer >60 >60 mL/min    Comment: (NOTE) The eGFR has been calculated using the CKD EPI equation. This calculation has not been validated in all clinical situations. eGFR's persistently <60 mL/min signify possible Chronic Kidney Disease.    Anion gap 9 5 - 15    Comment: Performed at [facility], [place] [address], [place], [place] [address]  Type and screen [place]     Status: None   Collection Time: 06/04/17  1:17 PM  Result Value Ref Range   ABO/RH(D) O POS    Antibody Screen NEG    Sample Expiration      06/07/2017 Performed at [facility], [place] [address], [place], [place] [address]   Protime-INR     Status: None   Collection Time: 06/04/17  2:00 PM  Result Value Ref Range   Prothrombin Time 14.0 11.4 - 15.2 seconds   INR 1.09     Comment: Performed at [facility], [place] [address] [place], [place] [address]  Basic metabolic panel     Status: Abnormal   Collection Time: 06/05/17  5:17 AM  Result Value Ref Range   Sodium 140 135 -  145 mmol/L   Potassium 3.2 (L) 3.5 - 5.1 mmol/L   Chloride 102 101 - 111 mmol/L   CO2 29 22 - 32 mmol/L   Glucose, Bld 110 (H) 65 - 99 mg/dL   BUN 10 6 - 20 mg/dL   Creatinine, Ser 0.95 0.61 - 1.24 mg/dL   Calcium 8.5 (L) 8.9 - 10.3 mg/dL   GFR calc non Af Amer >60 >60 mL/min   GFR calc Af Amer >60 >60 mL/min    Comment: (NOTE) The eGFR has been calculated using the CKD EPI equation. This calculation has not been validated in all clinical situations. eGFR's persistently <60 mL/min signify possible Chronic Kidney Disease.    Anion gap 9 5 - 15    Comment: Performed at [facility], [place] [address] [place], [place] [address]  CBC     Status: Abnormal   Collection Time: 06/05/17  5:17 AM  Result Value Ref Range   WBC 11.7 (H) 4.0 - 10.5 K/uL   RBC 5.03 4.22 - 5.81 MIL/uL   Hemoglobin 13.8 13.0 - 17.0 g/dL   HCT 42.6 39.0 - 52.0 %   MCV 84.7 78.0 - 100.0 fL   MCH 27.4 26.0 - 34.0 pg   MCHC 32.4 30.0 - 36.0 g/dL   RDW 15.4 11.5 - 15.5 %   Platelets 316 150 - 400 K/uL    Comment: Performed at [facility], [place] [address], [place], [place] [address]  Protime-INR     Status: None   Collection Time: 06/05/17  5:17 AM  Result Value Ref Range   Prothrombin Time 13.3 11.4 -  15.2 seconds   INR 1.02     Comment: Performed at [facility], [place] [address] [place], [place] [address]   Ct Abdomen Pelvis W Contrast  Result Date: 06/04/2017 CLINICAL DATA:  Soft tissue drainage and swelling EXAM: CT ABDOMEN AND PELVIS WITH CONTRAST TECHNIQUE: Multidetector CT imaging of the abdomen and pelvis was performed using the standard protocol following bolus administration of intravenous contrast. CONTRAST:  135m ISOVUE-300 IOPAMIDOL (ISOVUE-300) INJECTION 61% COMPARISON:  CT 04/19/2017, ultrasound 04/19/2017 FINDINGS: Lower chest: Lung bases demonstrate no acute consolidation or pleural effusion. Normal heart size. Hepatobiliary: Enlarged liver  measuring 20 cm craniocaudad. Possible subtle nodularity to the contour. Steatosis. No calcified gallstones or biliary dilatation Pancreas: Unremarkable. No pancreatic ductal dilatation or surrounding inflammatory changes. Spleen: Slightly enlarged at 15 cm. Adrenals/Urinary Tract: Adrenal glands are within normal limits. Left kidney is normal. Atrophic scarred right kidney. Lobulated thick-walled urinary bladder. Stomach/Bowel: Stomach nonenlarged. No dilated small bowel. No colon wall thickening. Vascular/Lymphatic: Nonaneurysmal aorta. No significantly enlarged lymph nodes. Reproductive: Prostate is unremarkable. Other: Negative for free air or free fluid. Right abdominal wall skin thickening with infiltration of the fat consistent with inflammation or infection. Decrease gas collection in the subcutaneous fat. Small residual fluid collection within the subcutaneous fat measuring 2.5 cm with small fascial defect and adjacent gas bubble of the right lateral anterior abdominal wall. Decreased intra-abdominal collection since prior CT. Tethered appearance of the right colon to the anterior abdominal wall. Musculoskeletal: Lumbar and sacral dysraphism. IMPRESSION: 1. Skin thickening of the right anterior, lateral abdominal wall with residual edema and inflammatory change in the subcutaneous fat. Decreased gas in the subcutaneous fat but suspected residual 2.5 cm fluid collection/soft tissue abscess in the region. Fluid is contiguous with the skin surface superficially and extends to the anterior abdominal wall where there is a tiny gas bubble and suggestion of fascial defect/possible small fistula to adherent colon in the region. No significant intra-abdominal abscess is seen. 2. Enlarged fatty liver with possible subtle nodular contour. Spleen also appears slightly enlarged 3. Lumbosacral dysraphism. 4. Thick-walled lobulated bladder is suspect for neurogenic bladder. Atrophic right kidney. Electronically Signed    By: KDonavan FoilM.D.   On: 06/04/2017 20:07      Assessment/Plan Abdominal wall abscess - seen on CT 3/3 - WBC 11.7, afebrile - patient with draining abdominal wound, surrounding erythema, no induration - to OR today for I&D with Dr. BBarry Dienes FEN: NPO, IVF VTE: SCDs, hold any further SQ heparin today for OR ID: IV zosyn/vanc 3/3>>  [facility]Brigid [facility] 06/05/2017, 7:40 AM Pager: [phone]: [phone]-Fri 7:00 am-4:30 pm Sat-Sun 7:00 am-11:30 am

## 2017-06-05 NOTE — Transfer of Care (Signed)
Immediate Anesthesia Transfer of Care Note  Patient: Thomas [name]  Procedure(s) Performed: IRRIGATION AND DEBRIDEMENT ABDOMINAL WALL ABSCESS (N/A )  Patient Location: PACU  Anesthesia Type:General  Level of Consciousness: awake, alert  and oriented  Airway & Oxygen Therapy: Patient Spontanous Breathing and Patient connected to face mask oxygen  Post-op Assessment: Report given to RN and Post -op Vital signs reviewed and stable  Post vital signs: Reviewed and stable  Last Vitals:  Vitals:   06/05/17 0602 06/05/17 1221  BP: 134/81 139/89  Pulse: 62 66  Resp: 18 18  Temp: 36.9 C 37.2 C  SpO2: 96% 96%    Last Pain:  Vitals:   06/05/17 1221  TempSrc: Oral      Patients Stated Pain Goal: 4 (06/05/17 1221)  Complications: No apparent anesthesia complications

## 2017-06-05 NOTE — Progress Notes (Signed)
PROGRESS NOTE  Thomas [name] WUJ:[id number] DOB: December 06, 1995 DOA: 06/04/2017 PCP: Iva Boop, MD  HPI/Recap of past 24 hours: Mr Mahon is a 22 yo male with PMH significant for morbid obesity with BMI 50,  Recurrent abdominal abscess who presented with another abscess post recent I&D. IV zosyn started in the ED. Admitted for I&D.  06/05/17: POD#0 post I&D. minimal abdominal pain.  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   Chronic leg pain  Right lower abdominal wall abscess post I&D POD #0 -IV zosyn 24 hours post op -surgery following -culture pending  HTN -conitnue home meds -hctz, tenormin  Chronic leg pain -as needed tylenol   Code Status: full  Family Communication: aunt   Disposition Plan: home after 24hr antibiotics post op    Consultants:  surgery  Procedures:  I&D  Antimicrobials:  IV zosyn  DVT prophylaxis:  SCDs   Objective: Vitals:   06/04/17 1800 06/04/17 1839 06/04/17 2056 06/05/17 0602  BP: 137/82 (!) 162/93 (!) 149/78 134/81  Pulse: 89 76 60 62  Resp: 16 19 18 18   Temp:  98.2 F (36.8 C) 98.8 F (37.1 C) 98.5 F (36.9 C)  TempSrc:  Oral Oral Oral  SpO2: 100% 94% 99% 96%  Weight:      Height:        Intake/Output Summary (Last 24 hours) at 06/05/2017 0859 Last data filed at 06/05/2017 0430 Gross per 24 hour  Intake 50 ml  Output 1000 ml  Net -950 ml   Filed Weights   06/04/17 1202  Weight: (!) 164.2 kg (362 lb)    Exam:   General:  22 yo CM morbidly obese A&O x 3 NAD  Cardiovascular: RRR no rubs or gallops  Respiratory: CTA no wheezes or rales  Abdomen: morbidly obese, wound on right  lower abdomen packed with surgical dressing  Musculoskeletal: non focal  Skin: as described above  Psychiatry: mood appropriate    Data Reviewed: CBC: Recent Labs  Lab 06/04/17 1231 06/05/17 0517  WBC 14.2* 11.7*  NEUTROABS 10.7*  --   HGB 14.2 13.8  HCT 44.0 42.6  MCV 84.9 84.7  PLT 311 316   Basic Metabolic Panel: Recent Labs  Lab 06/04/17 1231 06/05/17 0517  NA 138 140  K 3.4* 3.2*  CL 103 102  CO2 26 29  GLUCOSE 84 110*  BUN 11 10  CREATININE 0.83 0.95  CALCIUM 8.8* 8.5*   GFR: Estimated Creatinine Clearance: 192.9 mL/min (by C-G formula based on SCr of 0.95 mg/dL). Liver Function Tests: Recent Labs  Lab 06/04/17 1231  AST 32  ALT 43  ALKPHOS 84  BILITOT 2.1*  PROT 7.5  ALBUMIN 3.5   No results for input(s): LIPASE, AMYLASE in the last 168 hours. No results for input(s): AMMONIA in the last 168 hours. Coagulation Profile: Recent Labs  Lab 06/04/17 1400 06/05/17 0517  INR 1.09 1.02   Cardiac Enzymes: No results for input(s): CKTOTAL, CKMB, CKMBINDEX, TROPONINI in the last 168 hours. BNP (last 3 results) No results for input(s): PROBNP in the last 8760 hours. HbA1C: No results for input(s): HGBA1C in the last 72 hours. CBG: No results for  input(s): GLUCAP in the last 168 hours. Lipid Profile: No results for input(s): CHOL, HDL, LDLCALC, TRIG, CHOLHDL, LDLDIRECT in the last 72 hours. Thyroid Function Tests: No results for input(s): TSH, T4TOTAL, FREET4, T3FREE, THYROIDAB in the last 72 hours. Anemia Panel: No results for input(s): VITAMINB12, FOLATE, FERRITIN, TIBC, IRON, RETICCTPCT in the last 72 hours. Urine analysis:    Component Value Date/Time   COLORURINE AMBER (A) 04/19/2017 1851   APPEARANCEUR HAZY (A) 04/19/2017 1851   LABSPEC 1.014 04/19/2017 1851   PHURINE 6.0 04/19/2017 1851   GLUCOSEU NEGATIVE 04/19/2017 1851   HGBUR SMALL (A) 04/19/2017 1851   BILIRUBINUR NEGATIVE 04/19/2017 1851   KETONESUR NEGATIVE 04/19/2017 1851    PROTEINUR NEGATIVE 04/19/2017 1851   UROBILINOGEN 2.0 (H) 08/15/2009 1848   NITRITE NEGATIVE 04/19/2017 1851   LEUKOCYTESUR MODERATE (A) 04/19/2017 1851   Sepsis Labs: @LABRCNTIP (procalcitonin:4,lacticidven:4)  ) Recent Results (from the past 240 hour(s))  Wound or Superficial Culture     Status: None (Preliminary result)   Collection Time: 06/04/17  6:12 PM  Result Value Ref Range Status   Specimen Description   Final    ABSCESS Performed at [facility], [address] [place], [place] [address]    Special Requests   Final    Normal Performed at [facility], [address] [place], [place] [address]    Gram Stain   Final    MODERATE WBC PRESENT, PREDOMINANTLY PMN ABUNDANT GRAM NEGATIVE RODS Performed at [facility], [address] [place], [place] [address]    Culture PENDING  Incomplete   Report Status PENDING  Incomplete      Studies: Ct Abdomen Pelvis W Contrast  Result Date: 06/04/2017 CLINICAL DATA:  Soft tissue drainage and swelling EXAM: CT ABDOMEN AND PELVIS WITH CONTRAST TECHNIQUE: Multidetector CT imaging of the abdomen and pelvis was performed using the standard protocol following bolus administration of intravenous contrast. CONTRAST:  100mL ISOVUE-300 IOPAMIDOL (ISOVUE-300) INJECTION 61% COMPARISON:  CT 04/19/2017, ultrasound 04/19/2017 FINDINGS: Lower chest: Lung bases demonstrate no acute consolidation or pleural effusion. Normal heart size. Hepatobiliary: Enlarged liver measuring 20 cm craniocaudad. Possible subtle nodularity to the contour. Steatosis. No calcified gallstones or biliary dilatation Pancreas: Unremarkable. No pancreatic ductal dilatation or surrounding inflammatory changes. Spleen: Slightly enlarged at 15 cm. Adrenals/Urinary Tract: Adrenal glands are within normal limits. Left kidney is normal. Atrophic scarred right kidney. Lobulated thick-walled urinary bladder. Stomach/Bowel: Stomach nonenlarged. No  dilated small bowel. No colon wall thickening. Vascular/Lymphatic: Nonaneurysmal aorta. No significantly enlarged lymph nodes. Reproductive: Prostate is unremarkable. Other: Negative for free air or free fluid. Right abdominal wall skin thickening with infiltration of the fat consistent with inflammation or infection. Decrease gas collection in the subcutaneous fat. Small residual fluid collection within the subcutaneous fat measuring 2.5 cm with small fascial defect and adjacent gas bubble of the right lateral anterior abdominal wall. Decreased intra-abdominal collection since prior CT. Tethered appearance of the right colon to the anterior abdominal wall. Musculoskeletal: Lumbar and sacral dysraphism. IMPRESSION: 1. Skin thickening of the right anterior, lateral abdominal wall with residual edema and inflammatory change in the subcutaneous fat. Decreased gas in the subcutaneous fat but suspected residual 2.5 cm fluid collection/soft tissue abscess in the region. Fluid is contiguous with the skin surface superficially and extends to the anterior abdominal wall where there is a tiny gas bubble and suggestion of fascial defect/possible small fistula to adherent colon in the region. No significant intra-abdominal abscess is seen. 2.  Enlarged fatty liver with possible subtle nodular contour. Spleen also appears slightly enlarged 3. Lumbosacral dysraphism. 4. Thick-walled lobulated bladder is suspect for neurogenic bladder. Atrophic right kidney. Electronically Signed   By: Jasmine Pang M.D.   On: 06/04/2017 20:07    Scheduled Meds: . atenolol  100 mg Oral Daily  . Chlorhexidine Gluconate Cloth  6 each Topical Once  . [START ON 06/06/2017] heparin  5,000 Units Subcutaneous Q8H  . hydrochlorothiazide  25 mg Oral q morning - 10a  .  HYDROmorphone (DILAUDID) injection  1 mg Intravenous Once  . lidocaine  0-20 mL Intradermal Once  . lip balm  1 application Topical BID  . potassium chloride  40 mEq Oral TID     Continuous Infusions: . lactated ringers    . magnesium sulfate 1 - 4 g bolus IVPB    . piperacillin-tazobactam 3.375 g (06/05/17 0545)     LOS: 0 days     Darlin Drop, MD [facility] Pager [phone]  If 7PM-7AM, please contact night-coverage [email or web address] Password [email or web address] 06/05/2017, 8:59 AM

## 2017-06-05 NOTE — Anesthesia Preprocedure Evaluation (Signed)
Anesthesia Evaluation  Patient identified by MRN, date of birth, ID band Patient awake    Reviewed: Allergy & Precautions, NPO status , Patient's Chart, lab work & pertinent test results  Airway Mallampati: II  TM Distance: >3 FB Neck ROM: Full    Dental no notable dental hx.    Pulmonary neg pulmonary ROS,    Pulmonary exam normal breath sounds clear to auscultation       Cardiovascular hypertension, Normal cardiovascular exam Rhythm:Regular Rate:Normal     Neuro/Psych negative neurological ROS  negative psych ROS   GI/Hepatic negative GI ROS, Neg liver ROS,   Endo/Other  Morbid obesity  Renal/GU negative Renal ROS  negative genitourinary   Musculoskeletal negative musculoskeletal ROS (+)   Abdominal   Peds negative pediatric ROS (+)  Hematology negative hematology ROS (+)   Anesthesia Other Findings   Reproductive/Obstetrics negative OB ROS                             Anesthesia Physical Anesthesia Plan  ASA: III  Anesthesia Plan: General   Post-op Pain Management:    Induction: Intravenous  PONV Risk Score and Plan: 2 and Ondansetron, Dexamethasone and Treatment may vary due to age or medical condition  Airway Management Planned: Oral ETT  Additional Equipment:   Intra-op Plan:   Post-operative Plan: Extubation in OR  Informed Consent: I have reviewed the patients History and Physical, chart, labs and discussed the procedure including the risks, benefits and alternatives for the proposed anesthesia with the patient or authorized representative who has indicated his/her understanding and acceptance.   Dental advisory given  Plan Discussed with: CRNA and Surgeon  Anesthesia Plan Comments:         Anesthesia Quick Evaluation

## 2017-06-06 ENCOUNTER — Encounter (HOSPITAL_COMMUNITY): Payer: Self-pay | Admitting: General Surgery

## 2017-06-06 DIAGNOSIS — L03319 Cellulitis of trunk, unspecified: Secondary | ICD-10-CM | POA: Diagnosis not present

## 2017-06-06 DIAGNOSIS — L0291 Cutaneous abscess, unspecified: Secondary | ICD-10-CM | POA: Diagnosis not present

## 2017-06-06 DIAGNOSIS — L02211 Cutaneous abscess of abdominal wall: Secondary | ICD-10-CM | POA: Diagnosis not present

## 2017-06-06 DIAGNOSIS — Q059 Spina bifida, unspecified: Secondary | ICD-10-CM | POA: Diagnosis not present

## 2017-06-06 DIAGNOSIS — M79604 Pain in right leg: Secondary | ICD-10-CM | POA: Diagnosis not present

## 2017-06-06 DIAGNOSIS — I129 Hypertensive chronic kidney disease with stage 1 through stage 4 chronic kidney disease, or unspecified chronic kidney disease: Secondary | ICD-10-CM | POA: Diagnosis not present

## 2017-06-06 DIAGNOSIS — E876 Hypokalemia: Secondary | ICD-10-CM | POA: Diagnosis not present

## 2017-06-06 DIAGNOSIS — N319 Neuromuscular dysfunction of bladder, unspecified: Secondary | ICD-10-CM | POA: Diagnosis not present

## 2017-06-06 DIAGNOSIS — I10 Essential (primary) hypertension: Secondary | ICD-10-CM | POA: Diagnosis not present

## 2017-06-06 DIAGNOSIS — L02219 Cutaneous abscess of trunk, unspecified: Secondary | ICD-10-CM | POA: Diagnosis not present

## 2017-06-06 DIAGNOSIS — Z221 Carrier of other intestinal infectious diseases: Secondary | ICD-10-CM | POA: Diagnosis not present

## 2017-06-06 LAB — CBC
HEMATOCRIT: 45.8 % (ref 39.0–52.0)
HEMOGLOBIN: 14.8 g/dL (ref 13.0–17.0)
MCH: 27.4 pg (ref 26.0–34.0)
MCHC: 32.3 g/dL (ref 30.0–36.0)
MCV: 84.8 fL (ref 78.0–100.0)
Platelets: 415 10*3/uL — ABNORMAL HIGH (ref 150–400)
RBC: 5.4 MIL/uL (ref 4.22–5.81)
RDW: 15.1 % (ref 11.5–15.5)
WBC: 14.1 10*3/uL — ABNORMAL HIGH (ref 4.0–10.5)

## 2017-06-06 LAB — BASIC METABOLIC PANEL
Anion gap: 11 (ref 5–15)
BUN: 12 mg/dL (ref 6–20)
CHLORIDE: 107 mmol/L (ref 101–111)
CO2: 23 mmol/L (ref 22–32)
CREATININE: 0.87 mg/dL (ref 0.61–1.24)
Calcium: 8.9 mg/dL (ref 8.9–10.3)
GFR calc Af Amer: >60 mL/min (ref >60)
GFR calc non Af Amer: >60 mL/min (ref >60)
GLUCOSE: 132 mg/dL — AB (ref 65–99)
POTASSIUM: 4.1 mmol/L (ref 3.5–5.1)
Sodium: 141 mmol/L (ref 135–145)

## 2017-06-06 MED ORDER — OXYCODONE HCL 5 MG PO TABS
5.0000 mg | ORAL_TABLET | ORAL | Status: DC | PRN
  Administered 2017-06-07: 5 mg via ORAL
  Filled 2017-06-06: qty 1

## 2017-06-06 MED ORDER — HYDROCORTISONE 1 % EX CREA: 1.0000 "application " | TOPICAL_CREAM | Freq: Three times a day (TID) | CUTANEOUS | Status: DC | PRN

## 2017-06-06 NOTE — Progress Notes (Signed)
[facility] Progress Note  1 Day Post-Op  Subjective: CC: abdominal wall abscess and cellulitis Minimal pain overnight. No nausea/vomiting. Tolerating diet. UOP good. VSS.   Objective: Vital signs in last 24 hours: Temp:  [98.2 F (36.8 C)-99 F (37.2 C)] 98.4 F (36.9 C) (03/05 1009) Pulse Rate:  [56-72] 70 (03/05 1009) Resp:  [14-25] 16 (03/05 1009) BP: (121-157)/(64-102) 132/80 (03/05 1009) SpO2:  [96 %-100 %] 99 % (03/05 1009) Weight:  [161.9 kg (357 lb)] 161.9 kg (357 lb) (03/05 0500) Last BM Date: 06/04/17  Intake/Output from previous day: 03/04 0701 - 03/05 0700 In: 2060 [P.O.:960; I.V.:900; IV Piggyback:200] Out: 1610 [Urine:1600; Blood:10] Intake/Output this shift: Total I/O In: 480 [P.O.:480] Out: -   PE: Gen:  Alert, NAD, pleasant Pulm:  Normal effort Abd: Soft, non-tender, non-distended, bowel sounds present Skin: abdominal wound with good granulation tissue, minimal purulence, improvement in surrounding erythema   Psych: A&Ox3   Lab Results:  Recent Labs    06/05/17 0517 06/06/17 0502  WBC 11.7* 14.1*  HGB 13.8 14.8  HCT 42.6 45.8  PLT 316 415*   BMET Recent Labs    06/05/17 0517 06/06/17 0502  NA 140 141  K 3.2* 4.1  CL 102 107  CO2 29 23  GLUCOSE 110* 132*  BUN 10 12  CREATININE 0.95 0.87  CALCIUM 8.5* 8.9   PT/INR Recent Labs    06/04/17 1400 06/05/17 0517  LABPROT 14.0 13.3  INR 1.09 1.02   CMP     Component Value Date/Time   NA 141 06/06/2017 0502   K 4.1 06/06/2017 0502   CL 107 06/06/2017 0502   CO2 23 06/06/2017 0502   GLUCOSE 132 (H) 06/06/2017 0502   BUN 12 06/06/2017 0502   CREATININE 0.87 06/06/2017 0502   CALCIUM 8.9 06/06/2017 0502   PROT 7.5 06/04/2017 1231   ALBUMIN 3.5 06/04/2017 1231   AST 32 06/04/2017 1231   ALT 43 06/04/2017 1231   ALKPHOS 84 06/04/2017 1231   BILITOT 2.1 (H) 06/04/2017 1231   GFRNONAA >60 06/06/2017 0502   GFRAA >60 06/06/2017 0502   Lipase  No results found  for: LIPASE     Studies/Results: Ct Abdomen Pelvis W Contrast  Result Date: 06/04/2017 CLINICAL DATA:  Soft tissue drainage and swelling EXAM: CT ABDOMEN AND PELVIS WITH CONTRAST TECHNIQUE: Multidetector CT imaging of the abdomen and pelvis was performed using the standard protocol following bolus administration of intravenous contrast. CONTRAST:  ISOVUE-300 IOPAMIDOL (ISOVUE-300) INJECTION 61% COMPARISON:  CT 04/19/2017, ultrasound 04/19/2017 FINDINGS: Lower chest: Lung bases demonstrate no acute consolidation or pleural effusion. Normal heart size. Hepatobiliary: Enlarged liver measuring 20 cm craniocaudad. Possible subtle nodularity to the contour. Steatosis. No calcified gallstones or biliary dilatation Pancreas: Unremarkable. No pancreatic ductal dilatation or surrounding inflammatory changes. Spleen: Slightly enlarged at 15 cm. Adrenals/Urinary Tract: Adrenal glands are within normal limits. Left kidney is normal. Atrophic scarred right kidney. Lobulated thick-walled urinary bladder. Stomach/Bowel: Stomach nonenlarged. No dilated small bowel. No colon wall thickening. Vascular/Lymphatic: Nonaneurysmal aorta. No significantly enlarged lymph nodes. Reproductive: Prostate is unremarkable. Other: Negative for free air or free fluid. Right abdominal wall skin thickening with infiltration of the fat consistent with inflammation or infection. Decrease gas collection in the subcutaneous fat. Small residual fluid collection within the subcutaneous fat measuring 2.5 cm with small fascial defect and adjacent gas bubble of the right lateral anterior abdominal wall. Decreased intra-abdominal collection since prior CT. Tethered appearance of the right colon to the  anterior abdominal wall. Musculoskeletal: Lumbar and sacral dysraphism. IMPRESSION: 1. Skin thickening of the right anterior, lateral abdominal wall with residual edema and inflammatory change in the subcutaneous fat. Decreased gas in the subcutaneous  fat but suspected residual 2.5 cm fluid collection/soft tissue abscess in the region. Fluid is contiguous with the skin surface superficially and extends to the anterior abdominal wall where there is a tiny gas bubble and suggestion of fascial defect/possible small fistula to adherent colon in the region. No significant intra-abdominal abscess is seen. 2. Enlarged fatty liver with possible subtle nodular contour. Spleen also appears slightly enlarged 3. Lumbosacral dysraphism. 4. Thick-walled lobulated bladder is suspect for neurogenic bladder. Atrophic right kidney. Electronically Signed   By: Jasmine PangKim  Fujinaga M.D.   On: 06/04/2017 20:07    Anti-infectives: Anti-infectives (From admission, onward)   Start     Dose/Rate Route Frequency Ordered Stop   06/04/17 2200  piperacillin-tazobactam (ZOSYN) IVPB 3.375 g     3.375 g 12.5 mL/hr over 240 Minutes Intravenous Every 8 hours 06/04/17 1705     06/04/17 2000  vancomycin (VANCOCIN) IVPB 1000 mg/200 mL premix     1,000 mg 100 mL/hr over 120 Minutes Intravenous  Once 06/04/17 1731 06/04/17 2156   06/04/17 1245  piperacillin-tazobactam (ZOSYN) IVPB 3.375 g     3.375 g 100 mL/hr over 30 Minutes Intravenous  Once 06/04/17 1231 06/04/17 1531       Assessment/Plan Abdominal wall abscess S/p I&D 06/05/17 Dr. Donell BeersByerly  - WBC 14.1 - expect rise due to post-surgical inflammatory response, continue to trend - cx pending - Patient tolerated first dressing change very well - daily dressing change, patient may shower prior to in order to help cleanse wound - may go home when afebrile and tolerating dressing changes with PO medications - possibly tomorrow - d/c on 5d PO abx  FEN: HH diet VTE: SCDs, SQ heparin  ID: IV zosyn/vanc 3/3>>    LOS: 0 days    Wells GuilesKelly Rayburn , [facility] 06/06/2017, 11:38 AM Pager: [phone] Consults: [phone] Mon-Fri 7:00 am-4:30 pm Sat-Sun 7:00 am-11:30 am

## 2017-06-06 NOTE — Anesthesia Postprocedure Evaluation (Signed)
Anesthesia Post Note  Patient: Thomas [name]  Procedure(s) Performed: IRRIGATION AND DEBRIDEMENT ABDOMINAL WALL ABSCESS (N/A )     Patient location during evaluation: PACU Anesthesia Type: General Level of consciousness: awake and alert Pain management: pain level controlled Vital Signs Assessment: post-procedure vital signs reviewed and stable Respiratory status: spontaneous breathing, nonlabored ventilation, respiratory function stable and patient connected to nasal cannula oxygen Cardiovascular status: blood pressure returned to baseline and stable Postop Assessment: no apparent nausea or vomiting Anesthetic complications: no    Last Vitals:  Vitals:   06/06/17 0549 06/06/17 1009  BP: 121/66 132/80  Pulse: 66 70  Resp: 18 16  Temp: 36.8 C 36.9 C  SpO2: 98% 99%    Last Pain:  Vitals:   06/06/17 1041  TempSrc:   PainSc: 0-No pain                 Caleesi Kohl S

## 2017-06-06 NOTE — Discharge Instructions (Signed)
WOUND CARE: - midline dressing to be changed daily - supplies: sterile saline, kerlix, scissors, ABD pads, tape  - remove dressing and all packing carefully, moistening with sterile saline as needed to avoid packing/internal dressing sticking to the wound. - clean edges of skin around the wound with water/gauze, making sure there is no tape debris or leakage left on skin that could cause skin irritation or breakdown. - dampen and clean kerlix with sterile saline and pack wound from wound base to skin level, making sure to take note of any possible areas of wound tracking, tunneling and packing appropriately. Wound can be packed loosely. Trim kerlix to size if a whole kerlix is not required. - cover wound with a dry ABD pad and secure with tape.  - write the date/time on the dry dressing/tape to better track when the last dressing change occurred. - apply any skin protectant/powder recommended by clinician to protect skin/skin folds. - change dressing as needed if leakage occurs, wound gets contaminated, or patient requests to shower. - patient may shower daily with wound open and following the shower the wound should be dried and a clean dressing placed.

## 2017-06-06 NOTE — Progress Notes (Signed)
PROGRESS NOTE  Alieu Finnigan Batson NWG:[id number] DOB: 1996-03-16 DOA: 06/04/2017 PCP: Iva Boop, MD  HPI/Recap of past 24 hours: Mr Macaluso is a 22 yo male with PMH significant for morbid obesity with BMI 50,  Recurrent abdominal abscess who presented with another abscess post recent I&D. IV zosyn started in the ED. Admitted for I&D.  06/05/17: POD#0 post I&D. minimal abdominal pain.  06/06/17: tolerating dressing changes well. Continue IV antibiotics. minimal abd pain. No nausea. Afebrile. Wbc 14k most likely reactive.  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   Chronic leg pain  Right lower abdominal wall abscess post I&D POD #1 -continue IV zosyn  -surgery following -culture few gram negative rods -discharge tomorrow on po antibiotics -MRSA negative -hemoglobin a1c ordered for am  Leukocytosis -most likely reactive from surgery -wbc 14k from 11k -cbc and procalcitonin ordered for am   Hypokalemia, resolved -K+ 4.1 from 3.2  HTN -conitnue home meds -hctz, tenormin  Chronic leg pain -as needed tylenol  Morbid obesity -weight loss outpatient   Code Status: full  Family Communication: aunt   Disposition Plan: home after 24hr antibiotics post op    Consultants:  surgery  Procedures:  I&D  Antimicrobials:  IV zosyn  DVT prophylaxis:  SCDs   Objective: Vitals:   06/06/17 0500 06/06/17 0549 06/06/17 1009 06/06/17 1346  BP:  121/66 132/80 130/75  Pulse:  66 70 61  Resp:  18 16 16   Temp:  98.2 F (36.8 C) 98.4 F (36.9 C) 98.4 F (36.9 C)  TempSrc:  Oral Oral Oral  SpO2:  98% 99% 98%  Weight: (!) 161.9 kg (357 lb)       Height:        Intake/Output Summary (Last 24 hours) at 06/06/2017 1705 Last data filed at 06/06/2017 1516 Gross per 24 hour  Intake 1970 ml  Output 2400 ml  Net -430 ml   Filed Weights   06/04/17 1202 06/06/17 0500  Weight: (!) 164.2 kg (362 lb) (!) 161.9 kg (357 lb)    Exam: 06/06/17 seen and examined. Physical exam unchanged from yesterday.   General:  22 yo CM morbidly obese A&O x 3 NAD  Cardiovascular: RRR no rubs or gallops  Respiratory: CTA no wheezes or rales  Abdomen: morbidly obese, wound on right lower abdomen packed with surgical dressing  Musculoskeletal: non focal  Skin: as described above  Psychiatry: mood appropriate    Data Reviewed: CBC: Recent Labs  Lab 06/04/17 1231 06/05/17 0517 06/06/17 0502  WBC 14.2* 11.7* 14.1*  NEUTROABS 10.7*  --   --   HGB 14.2 13.8 14.8  HCT 44.0 42.6 45.8  MCV 84.9 84.7 84.8  PLT 311 316 415*   Basic Metabolic Panel: Recent Labs  Lab 06/04/17 1231 06/05/17 0517 06/06/17 0502  NA 138 140 141  K 3.4* 3.2* 4.1  CL 103 102 107  CO2 26 29 23   GLUCOSE 84 110* 132*  BUN 11 10 12   CREATININE 0.83 0.95 0.87  CALCIUM 8.8* 8.5* 8.9   GFR: Estimated Creatinine Clearance: 208.8 mL/min (by C-G formula based on SCr of 0.87 mg/dL). Liver Function Tests: Recent Labs  Lab  06/04/17 1231  AST 32  ALT 43  ALKPHOS 84  BILITOT 2.1*  PROT 7.5  ALBUMIN 3.5   No results for input(s): LIPASE, AMYLASE in the last 168 hours. No results for input(s): AMMONIA in the last 168 hours. Coagulation Profile: Recent Labs  Lab 06/04/17 1400 06/05/17 0517  INR 1.09 1.02   Cardiac Enzymes: No results for input(s): CKTOTAL, CKMB, CKMBINDEX, TROPONINI in the last 168 hours. BNP (last 3 results) No results for input(s): PROBNP in the last 8760 hours. HbA1C: No results for input(s): HGBA1C in the last 72 hours. CBG: No results for input(s): GLUCAP in the last 168 hours. Lipid Profile: No results for input(s): CHOL, HDL,  LDLCALC, TRIG, CHOLHDL, LDLDIRECT in the last 72 hours. Thyroid Function Tests: No results for input(s): TSH, T4TOTAL, FREET4, T3FREE, THYROIDAB in the last 72 hours. Anemia Panel: No results for input(s): VITAMINB12, FOLATE, FERRITIN, TIBC, IRON, RETICCTPCT in the last 72 hours. Urine analysis:    Component Value Date/Time   COLORURINE AMBER (A) 04/19/2017 1851   APPEARANCEUR HAZY (A) 04/19/2017 1851   LABSPEC 1.014 04/19/2017 1851   PHURINE 6.0 04/19/2017 1851   GLUCOSEU NEGATIVE 04/19/2017 1851   HGBUR SMALL (A) 04/19/2017 1851   BILIRUBINUR NEGATIVE 04/19/2017 1851   KETONESUR NEGATIVE 04/19/2017 1851   PROTEINUR NEGATIVE 04/19/2017 1851   UROBILINOGEN 2.0 (H) 08/15/2009 1848   NITRITE NEGATIVE 04/19/2017 1851   LEUKOCYTESUR MODERATE (A) 04/19/2017 1851   Sepsis Labs: @LABRCNTIP (procalcitonin:4,lacticidven:4)  ) Recent Results (from the past 240 hour(s))  Blood culture (routine x 2)     Status: None (Preliminary result)   Collection Time: 06/04/17 12:32 PM  Result Value Ref Range Status   Specimen Description   Final    BLOOD RIGHT HAND Performed at [facility], [address] [place], [place] [address]    Special Requests   Final    BOTTLES DRAWN AEROBIC AND ANAEROBIC Blood Culture adequate volume Performed at [facility], [address] [place], [place] [address]    Culture   Final    NO GROWTH 1 DAY Performed at [facility], [address] [place], [place] [address]    Report Status PENDING  Incomplete  Blood culture (routine x 2)     Status: None (Preliminary result)   Collection Time: 06/04/17 12:57 PM  Result Value Ref Range Status   Specimen Description   Final    RIGHT ANTECUBITAL Performed at [facility], [address] [place], [place] [address]    Special Requests   Final    BOTTLES DRAWN AEROBIC AND ANAEROBIC Blood Culture adequate volume Performed at [facility], [address] [place], [place] [address]    Culture   Final    NO GROWTH 1 DAY Performed at [facility], [address], [place], [place] [address]    Report Status PENDING  Incomplete  Wound or Superficial Culture     Status: None (Preliminary result)   Collection Time: 06/04/17  6:12 PM  Result Value Ref Range Status   Specimen Description   Final    ABSCESS Performed at [facility], [address] [place], [place] [address]    Special Requests   Final    Normal Performed at [facility], [address], [place], [place] [address]    Gram Stain   Final    MODERATE WBC PRESENT, PREDOMINANTLY PMN ABUNDANT GRAM NEGATIVE RODS  Culture   Final    CULTURE REINCUBATED FOR BETTER GROWTH Performed at [facility], [address] [place], [place] [address]    Report Status PENDING  Incomplete  MRSA PCR Screening     Status: None   Collection Time: 06/05/17 11:29 AM  Result Value Ref Range Status   MRSA by PCR NEGATIVE NEGATIVE Final    Comment:        The GeneXpert MRSA Assay (FDA approved for NASAL specimens only), is one component of a comprehensive MRSA colonization surveillance program. It is not intended to diagnose MRSA infection nor to guide or monitor treatment for MRSA infections. Performed at [facility], [address] [place], [place] [address]   Aerobic/Anaerobic Culture (surgical/deep wound)     Status: None (Preliminary result)   Collection Time: 06/05/17  2:14 PM  Result Value Ref Range Status   Specimen Description   Final    ABSCESS RIGHT ABDOMEN WAL Performed at [facility], [address] [place], [place] [address]    Special Requests   Final    NONE Performed at [facility], [address] [place], [place] [address]    Gram Stain   Final    FEW WBC PRESENT,BOTH PMN AND MONONUCLEAR NO ORGANISMS SEEN Performed at [facility], [address] [place], [place] [address]    Culture FEW GRAM NEGATIVE RODS  Final   Report Status PENDING  Incomplete      Studies: No results found.  Scheduled Meds: . atenolol  100 mg Oral Daily  . heparin  5,000 Units Subcutaneous Q8H  . hydrochlorothiazide  25 mg Oral q morning - 10a  .  HYDROmorphone (DILAUDID) injection  1 mg Intravenous Once  . lidocaine  0-20 mL Intradermal Once  . lip balm  1 application Topical BID    Continuous Infusions: . lactated ringers    . piperacillin-tazobactam 3.375 g (06/06/17 1517)     LOS: 0 days     Darlin Droparole N Trust Leh, MD [facility] Pager [phone]  If 7PM-7AM, please contact night-coverage [email or web address] Password [email or web address] 06/06/2017, 5:05 PM

## 2017-06-07 DIAGNOSIS — L03319 Cellulitis of trunk, unspecified: Secondary | ICD-10-CM | POA: Diagnosis not present

## 2017-06-07 DIAGNOSIS — L02219 Cutaneous abscess of trunk, unspecified: Secondary | ICD-10-CM | POA: Diagnosis not present

## 2017-06-07 LAB — AEROBIC CULTURE W GRAM STAIN (SUPERFICIAL SPECIMEN): Special Requests: NORMAL

## 2017-06-07 LAB — PROCALCITONIN

## 2017-06-07 LAB — HEMOGLOBIN A1C
Hgb A1c MFr Bld: 4.9 % (ref 4.8–5.6)
Mean Plasma Glucose: 93.93 mg/dL

## 2017-06-07 LAB — CBC
HCT: 47.2 % (ref 39.0–52.0)
Hemoglobin: 14.5 g/dL (ref 13.0–17.0)
MCH: 27 pg (ref 26.0–34.0)
MCHC: 30.7 g/dL (ref 30.0–36.0)
MCV: 87.7 fL (ref 78.0–100.0)
PLATELETS: 412 10*3/uL — AB (ref 150–400)
RBC: 5.38 MIL/uL (ref 4.22–5.81)
RDW: 15.6 % — AB (ref 11.5–15.5)
WBC: 12.9 10*3/uL — AB (ref 4.0–10.5)

## 2017-06-07 LAB — AEROBIC CULTURE  (SUPERFICIAL SPECIMEN)

## 2017-06-07 MED ORDER — OXYCODONE HCL 5 MG PO TABS
5.0000 mg | ORAL_TABLET | ORAL | Status: DC | PRN
Start: 2017-06-07 — End: 2017-06-07

## 2017-06-07 MED ORDER — MENTHOL 3 MG MT LOZG: 1.0000 | LOZENGE | OROMUCOSAL | 12 refills | Status: DC | PRN

## 2017-06-07 MED ORDER — CLINDAMYCIN HCL 300 MG PO CAPS: 300.0000 mg | ORAL_CAPSULE | Freq: Three times a day (TID) | ORAL | Status: DC

## 2017-06-07 MED ORDER — CLINDAMYCIN HCL 300 MG PO CAPS: 300.0000 mg | ORAL_CAPSULE | Freq: Three times a day (TID) | ORAL | 0 refills | Status: AC

## 2017-06-07 MED ORDER — NAPROXEN SODIUM 275 MG PO TABS
550.0000 mg | ORAL_TABLET | Freq: Two times a day (BID) | ORAL | Status: DC | PRN
  Filled 2017-06-07: qty 2

## 2017-06-07 MED ORDER — OXYCODONE HCL 5 MG PO TABS: 5.0000 mg | ORAL_TABLET | Freq: Four times a day (QID) | ORAL | 0 refills | Status: DC | PRN

## 2017-06-07 MED ORDER — OXYCODONE HCL 5 MG PO TABS: 5.0000 mg | ORAL_TABLET | Freq: Four times a day (QID) | ORAL | 0 refills | Status: AC | PRN

## 2017-06-07 MED ORDER — HYDROCORTISONE 1 % EX CREA: 1.0000 "application " | TOPICAL_CREAM | Freq: Three times a day (TID) | CUTANEOUS | 0 refills | Status: DC | PRN

## 2017-06-07 NOTE — Discharge Summary (Signed)
Physician Discharge Summary [facility]       Patient: Thomas [name]                   Admit date: 06/04/2017   DOB: [birth year]             Discharge date:06/07/2017/10:51 AM ZOX:[id number]                           PCP: Iva Boop, MD Recommendations for Outpatient Follow-up:   1.  Please follow-up with your primary care physician within 1-2 weeks. 2. 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 IV zosyn started in the ED. Admitted for I&D.  06/05/17: POD#0 post I&D. minimal abdominal pain.  06/06/17: tolerating dressing changes well. Continue IV antibiotics. minimal abd pain. No nausea. Afebrile.   Right lower abdominal wall abscess post I&D on 06/05/17,  POD #2 by Dr. Donell Beers -Changed  IV zosyn to PO Clindamycin for 5 more days   -surgery was following  -culture few gram negative rods -discharging on po antibiotics -MRSA negative -hemoglobin a1c 4.9  Leukocytosis -most likely reactive from surgery -wbc 14k from 11k -cbc and procalcitonin  Improved   Hypokalemia, resolved  HTN -conitnue home meds -hctz, tenormin  Chronic leg pain -as needed  tylenol  Morbid obesity -weight loss outpatient   Code Status: full  Family Communication: aunt   Disposition Plan: home   Consultations:  gen surg   Procedures:  I&D on 06/05/17   ----------------------------------------------------------------------------------------------------------------------  Discharge Instructions:   Discharge Instructions    Activity as tolerated - No restrictions   Complete by:  As directed    Diet - low sodium heart healthy   Complete by:  As directed    Discharge instructions   Complete by:  As directed    Wound dressing Changes per instructions  F/up with surgery in 1 wk  Keep the wound as clean as possible   Increase activity slowly   Complete by:  As directed        Medication List    STOP taking these medications   cefpodoxime 200 MG tablet Commonly known as:  VANTIN     TAKE these medications   acetaminophen 500 MG tablet Commonly known as:  TYLENOL Take 1,000 mg by mouth every 6 (six) hours as needed for mild pain.   atenolol 100 MG tablet Commonly known as:  TENORMIN Take 100 mg by mouth daily.   clindamycin 300 MG capsule Commonly known as:  CLEOCIN Take 1 capsule (300 mg total) by mouth 3 (three) times daily for 5 days.   hydrochlorothiazide 25 MG tablet Commonly known as:  HYDRODIURIL Take 25 mg by mouth  every morning.   hydrocortisone cream 1 % Apply 1 application topically 3 (three) times daily as needed for itching (minor skin irritation. use Benadryl first for itching).   menthol-cetylpyridinium 3 MG lozenge Commonly known as:  CEPACOL Take 1 lozenge (3 mg total) by mouth as needed for sore throat (throat irritation / pain).   naproxen sodium 220 MG tablet Commonly known as:  ALEVE Take 440 mg by mouth 2 (two) times daily as needed (pain).   oxyCODONE 5 MG immediate release tablet Commonly known as:  Oxy IR/ROXICODONE Take 1 tablet (5 mg total) by mouth every 6 (six) hours as needed for severe  pain (take 30-45 min prior to dressing change).      Follow-up Information    Surgery, [facility]. Go on 06/20/2017.   Specialty:  General Surgery Why:  Your appointment is at 10:00 AM. Bring photo ID and insurance information. Please arrive 30 min prior to appointment time.  Contact information: [address] [place] [address] [phone]          Allergies  Allergen Reactions  . Codeine Rash  . Latex Rash and Swelling    Other reaction(s): Other (See Comments) Other Reaction: mouth swells   . Lorazepam Other (See Comments)    Hallucinations Other reaction(s): Hallucinations, Other (See Comments) Other Reaction: Hallucinations   . Sulfa Antibiotics Rash  . Vancomycin Other (See Comments)    Redman's syndrome  . Food     Tropical fruit  . Ciprofloxacin Nausea Only    Other reaction(s): Other (See Comments), Vomiting chills      Procedures/Studies: Ct Abdomen Pelvis W Contrast  Result Date: 06/04/2017 CLINICAL DATA:  Soft tissue drainage and swelling EXAM: CT ABDOMEN AND PELVIS WITH CONTRAST TECHNIQUE: Multidetector CT imaging of the abdomen and pelvis was performed using the standard protocol following bolus administration of intravenous contrast. CONTRAST:  ISOVUE-300 IOPAMIDOL (ISOVUE-300) INJECTION 61% COMPARISON:  CT 04/19/2017, ultrasound 04/19/2017 FINDINGS: Lower chest: Lung bases demonstrate no acute consolidation or pleural effusion. Normal heart size. Hepatobiliary: Enlarged liver measuring 20 cm craniocaudad. Possible subtle nodularity to the contour. Steatosis. No calcified gallstones or biliary dilatation Pancreas: Unremarkable. No pancreatic ductal dilatation or surrounding inflammatory changes. Spleen: Slightly enlarged at 15 cm. Adrenals/Urinary Tract: Adrenal glands are within normal limits. Left kidney is normal. Atrophic scarred right kidney. Lobulated thick-walled urinary bladder. Stomach/Bowel: Stomach nonenlarged. No dilated  small bowel. No colon wall thickening. Vascular/Lymphatic: Nonaneurysmal aorta. No significantly enlarged lymph nodes. Reproductive: Prostate is unremarkable. Other: Negative for free air or free fluid. Right abdominal wall skin thickening with infiltration of the fat consistent with inflammation or infection. Decrease gas collection in the subcutaneous fat. Small residual fluid collection within the subcutaneous fat measuring 2.5 cm with small fascial defect and adjacent gas bubble of the right lateral anterior abdominal wall. Decreased intra-abdominal collection since prior CT. Tethered appearance of the right colon to the anterior abdominal wall. Musculoskeletal: Lumbar and sacral dysraphism. IMPRESSION: 1. Skin thickening of the right anterior, lateral abdominal wall with residual edema and inflammatory change in the subcutaneous fat. Decreased gas in the subcutaneous fat but suspected residual 2.5 cm fluid collection/soft tissue abscess in the region. Fluid is contiguous with the skin surface superficially and extends to the anterior abdominal wall where there is a tiny gas bubble and suggestion of fascial defect/possible small fistula to adherent colon in the region. No significant intra-abdominal abscess is seen. 2. Enlarged fatty liver with possible subtle nodular contour. Spleen  also appears slightly enlarged 3. Lumbosacral dysraphism. 4. Thick-walled lobulated bladder is suspect for neurogenic bladder. Atrophic right kidney. Electronically Signed   By: Jasmine Pang M.D.   On: 06/04/2017 20:07      Subjective: Patient was seen and examined 06/07/2017, 10:51 AM Patient stable  Today. No acute distress.  No issues overnight Stable for discharge.  Discharge Exam:  Vitals:   06/06/17 1009 06/06/17 1346 06/06/17 2136 06/07/17 0528  BP: 132/80 130/75 (!) 150/87 139/77  Pulse: 70 61 67 (!) 54  Resp: Temp: 98.4 F (36.9 C) 98.4 F (36.9 C) 98.5 F (36.9 C) 97.9 F (36.6 C)   TempSrc: Oral Oral Oral Oral  SpO2: 99% 98% 99% 99%  Weight:    (!) 162 kg (357 lb 1.6 oz)  Height:        General: Pt lying comfortably in bed & appears in no obvious distress. Cardiovascular: S1 & S2 heard, RRR, S1/S2 +. No murmurs, rubs, gallops or clicks. No JVD or pedal edema. Respiratory: Clear to auscultation without wheezing, rhonchi or crackles. No increased work of breathing. Abdominal:  Non distended, non tender & soft. No organomegaly or masses appreciated. Normal bowel sounds heard. CNS: Alert and oriented. No focal deficits. Extremities: no edema, no cyanosis    The results of significant diagnostics from this hospitalization (including imaging, microbiology, ancillary and laboratory) are listed below for reference.     Microbiology: Recent Results (from the past 240 hour(s))  Blood culture (routine x 2)     Status: None (Preliminary result)   Collection Time: 06/04/17 12:32 PM  Result Value Ref Range Status   Specimen Description   Final    BLOOD RIGHT HAND Performed at [facility], [address] [place], [place] [address]    Special Requests   Final    BOTTLES DRAWN AEROBIC AND ANAEROBIC Blood Culture adequate volume Performed at [facility], [address], [place], [place] [address]    Culture   Final    NO GROWTH 1 DAY Performed at [facility], [address], [place], [place] [address]    Report Status PENDING  Incomplete  Blood culture (routine x 2)     Status: None (Preliminary result)   Collection Time: 06/04/17 12:57 PM  Result Value Ref Range Status   Specimen Description   Final    RIGHT ANTECUBITAL Performed at [facility], [address] [place], [place] [address]    Special Requests   Final    BOTTLES DRAWN AEROBIC AND ANAEROBIC Blood Culture adequate volume Performed at [facility], [address] [place], [place] [address]    Culture   Final    NO  GROWTH 1 DAY Performed at [facility], [address] [place], [place] [address]    Report Status PENDING  Incomplete  Wound or Superficial Culture     Status: None (Preliminary result)   Collection Time: 06/04/17  6:12 PM  Result Value Ref Range Status   Specimen Description   Final    ABSCESS Performed at [facility], [address] [place], [place] [address]    Special Requests   Final    Normal Performed at [facility], [address], [place], [place] [address]    Gram Stain   Final    MODERATE WBC PRESENT, PREDOMINANTLY PMN ABUNDANT GRAM NEGATIVE RODS    Culture  Final    CULTURE REINCUBATED FOR BETTER GROWTH Performed at [facility], [address], [place], [place] [address]    Report Status PENDING  Incomplete  MRSA PCR Screening     Status: None   Collection Time: 06/05/17 11:29 AM  Result Value Ref Range Status   MRSA by PCR NEGATIVE NEGATIVE Final    Comment:        The GeneXpert MRSA Assay (FDA approved for NASAL specimens only), is one component of a comprehensive MRSA colonization surveillance program. It is not intended to diagnose MRSA infection nor to guide or monitor treatment for MRSA infections. Performed at [facility], [address] [place], [place] [address]   Aerobic/Anaerobic Culture (surgical/deep wound)     Status: None (Preliminary result)   Collection Time: 06/05/17  2:14 PM  Result Value Ref Range Status   Specimen Description   Final    ABSCESS RIGHT ABDOMEN WAL Performed at [facility], [address] [place], [place] [address]    Special Requests   Final    NONE Performed at [facility], [address], [place], [place] [address]    Gram Stain   Final    FEW WBC PRESENT,BOTH PMN AND MONONUCLEAR NO ORGANISMS SEEN Performed at [facility], [address], [place], [place] [address]    Culture FEW GRAM NEGATIVE RODS   Final   Report Status PENDING  Incomplete     Labs: CBC: Recent Labs  Lab 06/04/17 1231 06/05/17 0517 06/06/17 0502 06/07/17 0505  WBC 14.2* 11.7* 14.1* 12.9*  NEUTROABS 10.7*  --   --   --   HGB 14.2 13.8 14.8 14.5  HCT 44.0 42.6 45.8 47.2  MCV 84.9 84.7 84.8 87.7  PLT 311 316 415* 412*   Basic Metabolic Panel: Recent Labs  Lab 06/04/17 1231 06/05/17 0517 06/06/17 0502  NA 138 140 141  K 3.4* 3.2* 4.1  CL 103 102 107  CO2 GLUCOSE 84 110* 132*  BUN CREATININE 0.83 0.95 0.87  CALCIUM 8.8* 8.5* 8.9   Liver Function Tests: Recent Labs  Lab 06/04/17 1231  AST 32  ALT 43  ALKPHOS 84  BILITOT 2.1*  PROT 7.5  ALBUMIN 3.5   BNP (last 3 results) No results for input(s): BNP in the last 8760 hours. Cardiac Enzymes: No results for input(s): CKTOTAL, CKMB, CKMBINDEX, TROPONINI in the last 168 hours. CBG: No results for input(s): GLUCAP in the last 168 hours. Hgb A1c Recent Labs    06/07/17 0505  HGBA1C 4.9   Lipid Profile No results for input(s): CHOL, HDL, LDLCALC, TRIG, CHOLHDL, LDLDIRECT in the last 72 hours. Thyroid function studies No results for input(s): TSH, T4TOTAL, T3FREE, THYROIDAB in the last 72 hours.  Invalid input(s): FREET3 Anemia work up No results for input(s): VITAMINB12, FOLATE, FERRITIN, TIBC, IRON, RETICCTPCT in the last 72 hours. Urinalysis    Component Value Date/Time   COLORURINE AMBER (A) 04/19/2017 1851   APPEARANCEUR HAZY (A) 04/19/2017 1851   LABSPEC 1.014 04/19/2017 1851   PHURINE 6.0 04/19/2017 1851   GLUCOSEU NEGATIVE 04/19/2017 1851   HGBUR SMALL (A) 04/19/2017 1851   BILIRUBINUR NEGATIVE 04/19/2017 1851   KETONESUR NEGATIVE 04/19/2017 1851   PROTEINUR NEGATIVE 04/19/2017 1851   UROBILINOGEN 2.0 (H) 08/15/2009 1848   NITRITE NEGATIVE 04/19/2017 1851   LEUKOCYTESUR MODERATE (A) 04/19/2017 1851    Time coordinating discharge: Over 30  minutes  SIGNED: Kendell Bane, MD, FACP, FHM. [facility],  Pager [phone]  If 7PM-7AM, please contact night-coverage [email or web address] 06/07/2017, 10:51 AM

## 2017-06-07 NOTE — Progress Notes (Signed)
[facility] Progress Note  2 Days Post-Op  Subjective: CC: abdominal wound Patient denies pain outside of dressing change. Some bleeding through outer dressing. UOP good. VSS.   Objective: Vital signs in last 24 hours: Temp:  [97.9 F (36.6 C)-98.5 F (36.9 C)] 97.9 F (36.6 C) (03/06 0528) Pulse Rate:  [54-67] 54 (03/06 0528) Resp:  [16] 16 (03/06 0528) BP: (130-150)/(75-87) 139/77 (03/06 0528) SpO2:  [98 %-99 %] 99 % (03/06 0528) Weight:  [162 kg (357 lb 1.6 oz)] 162 kg (357 lb 1.6 oz) (03/06 0528) Last BM Date: 06/04/17  Intake/Output from previous day: 03/05 0701 - 03/06 0700 In: 2260 [P.O.:2160; IV Piggyback:100] Out: 2400 [Urine:2400] Intake/Output this shift: No intake/output data recorded.  PE: Gen:  Alert, NAD, pleasant Pulm:  Normal effort Abd: Soft, non-tender, non-distended, bowel sounds present Skin: abdominal wound with good granulation tissue, minimal purulence, near resolution in surrounding erythema Psych: A&Ox3  Lab Results:  Recent Labs    06/06/17 0502 06/07/17 0505  WBC 14.1* 12.9*  HGB 14.8 14.5  HCT 45.8 47.2  PLT 415* 412*   BMET Recent Labs    06/05/17 0517 06/06/17 0502  NA 140 141  K 3.2* 4.1  CL 102 107  CO2 29 23  GLUCOSE 110* 132*  BUN 10 12  CREATININE 0.95 0.87  CALCIUM 8.5* 8.9   PT/INR Recent Labs    06/04/17 1400 06/05/17 0517  LABPROT 14.0 13.3  INR 1.09 1.02   CMP     Component Value Date/Time   NA 141 06/06/2017 0502   K 4.1 06/06/2017 0502   CL 107 06/06/2017 0502   CO2 23 06/06/2017 0502   GLUCOSE 132 (H) 06/06/2017 0502   BUN 12 06/06/2017 0502   CREATININE 0.87 06/06/2017 0502   CALCIUM 8.9 06/06/2017 0502   PROT 7.5 06/04/2017 1231   ALBUMIN 3.5 06/04/2017 1231   AST 32 06/04/2017 1231   ALT 43 06/04/2017 1231   ALKPHOS 84 06/04/2017 1231   BILITOT 2.1 (H) 06/04/2017 1231   GFRNONAA >60 06/06/2017 0502   GFRAA >60 06/06/2017 0502   Lipase  No results found for:  LIPASE     Studies/Results: No results found.  Anti-infectives: Anti-infectives (From admission, onward)   Start     Dose/Rate Route Frequency Ordered Stop   06/04/17 2200  piperacillin-tazobactam (ZOSYN) IVPB 3.375 g     3.375 g 12.5 mL/hr over 240 Minutes Intravenous Every 8 hours 06/04/17 1705     06/04/17 2000  vancomycin (VANCOCIN) IVPB 1000 mg/200 mL premix     1,000 mg 100 mL/hr over 120 Minutes Intravenous  Once 06/04/17 1731 06/04/17 2156   06/04/17 1245  piperacillin-tazobactam (ZOSYN) IVPB 3.375 g     3.375 g 100 mL/hr over 30 Minutes Intravenous  Once 06/04/17 1231 06/04/17 1531       Assessment/Plan Abdominal wall abscess S/p I&D 06/05/17 Dr. Donell Beers  - WBC 12.9 trending down, afebrile  - daily dressing change, patient may shower prior to in order to help cleanse wound - tolerating dressing change with PO pain medication this AM - cx still pending this AM - d/c on 5d PO abx  FEN: HH diet VTE: SCDs, SQ heparin  ID:IV zosyn/vanc 3/3>>  D/c home today with PO abx. Have sent Rx for pain medication to [facility] [address]. Have written patient letter for work and scheduled follow up in [facility] clinic. Patient's aunt has been present for dressing changes and will be assisting  patient with this.   LOS: 0 days    Wells GuilesKelly Rayburn , [facility] 06/07/2017, 10:31 AM Pager: [phone] Consults: [phone] Mon-Fri 7:00 am-4:30 pm Sat-Sun 7:00 am-11:30 am

## 2017-06-07 NOTE — Progress Notes (Signed)
Discharge instructions discussed with patient and family, verbalized agreement, prescriptions given to patient

## 2017-06-10 LAB — CULTURE, BLOOD (ROUTINE X 2)
CULTURE: NO GROWTH
Culture: NO GROWTH
SPECIAL REQUESTS: ADEQUATE
SPECIAL REQUESTS: ADEQUATE

## 2017-06-13 LAB — AEROBIC/ANAEROBIC CULTURE W GRAM STAIN (SURGICAL/DEEP WOUND)

## 2017-06-13 LAB — AEROBIC/ANAEROBIC CULTURE (SURGICAL/DEEP WOUND)

## 2017-06-20 DIAGNOSIS — L02211 Cutaneous abscess of abdominal wall: Secondary | ICD-10-CM | POA: Diagnosis not present

## 2017-07-24 DIAGNOSIS — L02211 Cutaneous abscess of abdominal wall: Secondary | ICD-10-CM | POA: Diagnosis not present

## 2017-08-21 DIAGNOSIS — L02211 Cutaneous abscess of abdominal wall: Secondary | ICD-10-CM | POA: Diagnosis not present

## 2017-09-11 DIAGNOSIS — L02211 Cutaneous abscess of abdominal wall: Secondary | ICD-10-CM | POA: Diagnosis not present

## 2017-10-12 ENCOUNTER — Other Ambulatory Visit: Payer: Self-pay | Admitting: Student

## 2017-10-12 DIAGNOSIS — L02211 Cutaneous abscess of abdominal wall: Secondary | ICD-10-CM

## 2017-10-24 ENCOUNTER — Ambulatory Visit
Admission: RE | Admit: 2017-10-24 | Discharge: 2017-10-24 | Disposition: A | Discharge status: Home / Self Care | Payer: 59 | Source: Ambulatory Visit | Attending: Student | Admitting: Student

## 2017-10-24 DIAGNOSIS — L02211 Cutaneous abscess of abdominal wall: Secondary | ICD-10-CM | POA: Diagnosis not present

## 2017-10-24 MED ORDER — IOPAMIDOL (ISOVUE-300) INJECTION 61%
125.0000 mL | Freq: Once | INTRAVENOUS | Status: AC | PRN
  Administered 2017-10-24: 125 mL via INTRAVENOUS

## 2017-10-30 DIAGNOSIS — L02211 Cutaneous abscess of abdominal wall: Secondary | ICD-10-CM | POA: Diagnosis not present

## 2017-10-30 DIAGNOSIS — N319 Neuromuscular dysfunction of bladder, unspecified: Secondary | ICD-10-CM | POA: Diagnosis not present

## 2017-10-30 DIAGNOSIS — Q059 Spina bifida, unspecified: Secondary | ICD-10-CM | POA: Diagnosis not present

## 2017-11-03 DIAGNOSIS — I872 Venous insufficiency (chronic) (peripheral): Secondary | ICD-10-CM | POA: Diagnosis not present

## 2017-11-03 DIAGNOSIS — I10 Essential (primary) hypertension: Secondary | ICD-10-CM | POA: Diagnosis not present

## 2017-12-11 DIAGNOSIS — L02211 Cutaneous abscess of abdominal wall: Secondary | ICD-10-CM | POA: Diagnosis not present

## 2017-12-13 ENCOUNTER — Other Ambulatory Visit (HOSPITAL_COMMUNITY): Payer: Self-pay | Admitting: General Surgery

## 2017-12-13 DIAGNOSIS — L02211 Cutaneous abscess of abdominal wall: Secondary | ICD-10-CM

## 2017-12-25 ENCOUNTER — Encounter (HOSPITAL_COMMUNITY): Payer: Self-pay | Admitting: Interventional Radiology

## 2017-12-25 ENCOUNTER — Ambulatory Visit (HOSPITAL_COMMUNITY)
Admission: RE | Admit: 2017-12-25 | Discharge: 2017-12-25 | Disposition: A | Discharge status: Home / Self Care | Payer: 59 | Source: Ambulatory Visit | Attending: General Surgery | Admitting: General Surgery

## 2017-12-25 DIAGNOSIS — L02211 Cutaneous abscess of abdominal wall: Secondary | ICD-10-CM | POA: Diagnosis not present

## 2017-12-25 DIAGNOSIS — K632 Fistula of intestine: Secondary | ICD-10-CM | POA: Diagnosis not present

## 2017-12-25 HISTORY — PX: IR SINUS/FIST TUBE CHK-NON GI: IMG673

## 2017-12-25 MED ORDER — IOPAMIDOL (ISOVUE-300) INJECTION 61%
INTRAVENOUS | Status: AC
  Administered 2017-12-25: 15 mL
  Filled 2017-12-25: qty 50

## 2017-12-25 MED ORDER — IOPAMIDOL (ISOVUE-300) INJECTION 61%
50.0000 mL | Freq: Once | INTRAVENOUS | Status: AC | PRN
  Administered 2017-12-25: 15 mL

## 2018-01-11 ENCOUNTER — Telehealth: Payer: Self-pay

## 2018-01-11 DIAGNOSIS — L02211 Cutaneous abscess of abdominal wall: Secondary | ICD-10-CM | POA: Diagnosis not present

## 2018-01-11 NOTE — Telephone Encounter (Signed)
02/12/18 at 2:45 pm with Dr Meridee Score letter mailed

## 2018-01-11 NOTE — Telephone Encounter (Signed)
-----   Message from Thomas Lofty., MD sent at 01/11/2018 12:54 PM EDT ----- Thomas [name], I looked at the fistulogram and his CT.  What a bit of a mess he has gone through. Although, I am not familiar with literature for this particular type of fistula, it is definitely worth considering. I'd be happy to see him in clinic and discuss and if he is willing we could try our OVESCO [place] Cl[place] Clip in effort to try and close this.  Chronic fistulae are more difficult than acute but could try if he likes and our colon scope with clip can make it to the region.   Thomas [name], can you get this gentleman a clinic visit with me in the next few weeks to discuss advanced closure technique. Thanks. Gabe ----- Message ----- From: Thomas Levee, MD Sent: 01/11/2018   9:35 AM EDT To: Thomas Lofty., MD  Thomas [name].  When he experience have you had with closing fistulas endoscopically?  I have a guy had a cecostomy tube for constipation as a child who now has a persistent fistula and no need for colonic irrigation anymore.  [name] had several big operations as a child and getting back into his abdomen may be difficult.  Let me know what you think  Thomas [name]

## 2018-02-12 ENCOUNTER — Other Ambulatory Visit (INDEPENDENT_AMBULATORY_CARE_PROVIDER_SITE_OTHER): Payer: 59

## 2018-02-12 ENCOUNTER — Ambulatory Visit: Payer: 59 | Admitting: Gastroenterology

## 2018-02-12 ENCOUNTER — Encounter: Payer: Self-pay | Admitting: Gastroenterology

## 2018-02-12 VITALS — BP 190/110 | HR 72 | Ht 70.5 in | Wt 350.4 lb

## 2018-02-12 DIAGNOSIS — R935 Abnormal findings on diagnostic imaging of other abdominal regions, including retroperitoneum: Secondary | ICD-10-CM | POA: Diagnosis not present

## 2018-02-12 DIAGNOSIS — L988 Other specified disorders of the skin and subcutaneous tissue: Secondary | ICD-10-CM | POA: Diagnosis not present

## 2018-02-12 DIAGNOSIS — K632 Fistula of intestine: Secondary | ICD-10-CM | POA: Diagnosis not present

## 2018-02-12 LAB — PROTIME-INR
INR: 1 ratio (ref 0.8–1.0)
PROTHROMBIN TIME: 11.9 s (ref 9.6–13.1)

## 2018-02-12 LAB — BASIC METABOLIC PANEL
BUN: 15 mg/dL (ref 6–23)
CALCIUM: 9.9 mg/dL (ref 8.4–10.5)
CO2: 33 meq/L — AB (ref 19–32)
CREATININE: 0.93 mg/dL (ref 0.40–1.50)
Chloride: 102 mEq/L (ref 96–112)
GFR: 107.37 mL/min (ref >60.00)
Glucose, Bld: 91 mg/dL (ref 70–99)
Potassium: 4.1 mEq/L (ref 3.5–5.1)
SODIUM: 141 meq/L (ref 135–145)

## 2018-02-12 LAB — CBC WITH DIFFERENTIAL/PLATELET
BASOS ABS: 0.1 10*3/uL (ref 0.0–0.1)
BASOS PCT: 0.8 % (ref 0.0–3.0)
Eosinophils Absolute: 0.3 10*3/uL (ref 0.0–0.7)
Eosinophils Relative: 2.2 % (ref 0.0–5.0)
HEMATOCRIT: 50.5 % (ref 39.0–52.0)
Hemoglobin: 16.4 g/dL (ref 13.0–17.0)
LYMPHS PCT: 20.7 % (ref 12.0–46.0)
Lymphs Abs: 2.8 10*3/uL (ref 0.7–4.0)
MCHC: 32.5 g/dL (ref 30.0–36.0)
MCV: 81.9 fl (ref 78.0–100.0)
MONOS PCT: 7.5 % (ref 3.0–12.0)
Monocytes Absolute: 1 10*3/uL (ref 0.1–1.0)
NEUTROS ABS: 9.2 10*3/uL — AB (ref 1.4–7.7)
Neutrophils Relative %: 68.8 % (ref 43.0–77.0)
PLATELETS: 281 10*3/uL (ref 150.0–400.0)
RBC: 6.17 Mil/uL — ABNORMAL HIGH (ref 4.22–5.81)
RDW: 16.1 % — AB (ref 11.5–15.5)
WBC: 13.4 10*3/uL — ABNORMAL HIGH (ref 4.0–10.5)

## 2018-02-12 NOTE — Progress Notes (Signed)
GASTROENTEROLOGY OUTPATIENT CLINIC VISIT   Primary Care Provider Via, Caryn Bee, MD [address] [place] [address] [phone]  Referring Provider Via, Caryn Bee, MD [address] [place], [place] [address] [phone]  Patient Profile: Thomas [name] is a 22 y.o. 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  The patient presents to the [facility] for an evaluation and management of problem(s) noted below:  Problem List 1. Colocutaneous fistula   2. Draining cutaneous sinus tract   3. Abnormal CT of the abdomen     History of Present Illness: This is the patient's first visit to the GI  clinic.  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  He has had multiple surgeries as a result of his lipomeningocele over the course of his years as he was younger.  He has had bowel and bladder revisions.  At this point in time patient no longer deals with constipation.  He is actually having a bowel movement on a daily basis.  Having read through the care everywhere notes there is suggestion that the patient needs to have a cecostomy tube that was required to help with flushing of his colon.  Eventually around the age of 51 he no longer required the cecostomy tube has been doing well with bowel movements on a daily basis.  Starting in the early portion of 2019 he developed an abdominal wound infection and this required an incision and drainage occurred.In March of this year the patient was found to have a right abdominal wall abscess that required an incision and drainage and subsequent follow-up with antibiotics and follow-up with surgery.  The patient developed continued flow from this particular region  however he has never seen overt stool or feces.  He underwent imaging in September of this year that showed a fistulous tract into the presumed cecum.  He currently packs the region and is not on any antibiotics.  He has noted expression of greenish/yellowish tinge to the region as well as blood at times but no overt pus as he previously had.  And again he has not noted any stool in this particular area.  He was seen in follow-up with colorectal surgery and the discussion was whether surgical intervention or an endoscopic therapy could be pursued to try and close this lesion.  It is for this reason that he is here today.  He is here with his mother and aunt and girlfriend.  Interestingly, the patient describes never having had a colonoscopy in his life.  He is never had an upper endoscopy either.  GI Review of Systems Positive as above Negative for pyrosis, dysphagia, odynophagia, hematochezia, melena, weight loss  Review of Systems General: Denies fevers/chills HEENT: Denies oral lesions Cardiovascular: Denies chest pain Pulmonary: Denies shortness of breath Gastroenterological: See HPI Genitourinary: Denies darkened urine or hematuria Hematological: Denies easy bruising/bleeding Endocrine: Denies temperature intolerance Dermatological: Denies jaundice Psychological: Mood is stable   Medications Current Outpatient Medications  Medication Sig Dispense Refill  . atenolol (TENORMIN) 100 MG tablet Take 100 mg by mouth daily.  3  . hydrochlorothiazide (HYDRODIURIL) 25 MG tablet Take 25 mg by mouth every morning.  3   No current facility-administered medications for this visit.     Allergies Allergies  Allergen Reactions  . Codeine Rash  .  Latex Rash and Swelling    Other reaction(s): Other (See Comments) Other Reaction: mouth swells   . Lorazepam Other (See Comments)    Hallucinations Other reaction(s): Hallucinations, Other (See Comments) Other Reaction: Hallucinations   . Sulfa  Antibiotics Rash  . Vancomycin Other (See Comments)    Redman's syndrome  . Food     Tropical fruit  . Ciprofloxacin Nausea Only    Other reaction(s): Other (See Comments), Vomiting chills    Histories Past Medical History:  Diagnosis Date  . Depression   . Hypertension   . Obesity   . Spina bifida [facility])    Past Surgical History:  Procedure Laterality Date  . ADENOIDECTOMY    . BACK SURGERY     [place]ipna Bifida repair x 2  . BLADDER SURGERY    . FRACTURE SURGERY Right    Heel  . IR SINUS/FIST TUBE CHK-NON GI  12/25/2017  . IRRIGATION AND DEBRIDEMENT ABSCESS N/A 06/05/2017   Procedure: IRRIGATION AND DEBRIDEMENT ABDOMINAL WALL ABSCESS;  Surgeon: Almond Lint, MD;  Location: [facility];  Service: General;  Laterality: N/A;  . PORT A CATH REVISION     multiple  . PORT-A-CATH REMOVAL    . STOMACH SURGERY    . TONSILLECTOMY     Social History   Socioeconomic History  . Marital status: Single    Spouse name: Not on file  . Number of children: 0  . Years of education: Not on file  . Highest education level: Not on file  Occupational History  . Not on file  Social Needs  . Financial resource strain: Not on file  . Food insecurity:    Worry: Not on file    Inability: Not on file  . Transportation needs:    Medical: Not on file    Non-medical: Not on file  Tobacco Use  . Smoking status: Never Smoker  . Smokeless tobacco: Never Used  Substance and Sexual Activity  . Alcohol use: Yes    Comment: occasional  . Drug use: No  . Sexual activity: Not on file  Lifestyle  . Physical activity:    Days per week: Not on file    Minutes per session: Not on file  . Stress: Not on file  Relationships  . Social connections:    Talks on phone: Not on file    Gets together: Not on file    Attends religious service: Not on file    Active member of club or organization: Not on file    Attends meetings of clubs or organizations: Not on file    Relationship status: Not on file  .  Intimate partner violence:    Fear of current or ex partner: Not on file    Emotionally abused: Not on file    Physically abused: Not on file    Forced sexual activity: Not on file  Other Topics Concern  . Not on file  Social History Narrative  . Not on file   Family History  Problem Relation Age of Onset  . Asthma Other   . Cancer Other   . COPD Other   . Hyperlipidemia Other   . Hypertension Other   . Stroke Other   . Lupus Mother   . Sjogren's syndrome Mother   . Sacroiliitis Mother   . Arthritis Mother        psoriatic  . Diabetes Maternal Grandfather   . Melanoma Maternal Grandfather   . Heart disease Paternal Grandfather   .  Sjogren's syndrome Maternal Aunt   . Other Maternal Aunt        ITP  . Melanoma Maternal Aunt    I have reviewed his medical, social, and family history in detail and updated the electronic medical record as necessary.    PHYSICAL EXAMINATION  BP (!) 190/110 (BP Location: Left Wrist, Patient Position: Sitting, Cuff Size: Normal)   Pulse 72   Ht 5' 10.5" (1.791 m) Comment: height measured without shoes  Wt (!) 350 lb 6 oz (158.9 kg)   BMI 49.56 kg/m   Wt Readings from Last 3 Encounters:  02/12/18 (!) 350 lb 6 oz (158.9 kg)  06/07/17 (!) 357 lb 1.6 oz (162 kg)  04/20/17 (!) 360 lb (163.3 kg)  GEN: NAD, appears stated age, doesn't appear chronically ill PSYCH: Cooperative, without pressured speech EYE: Conjunctivae pink, sclerae anicteric ENT: MMM, without oral ulcers, no erythema or exudates noted NECK: Supple, enlarged neck girth CV: RR without R/Gs  RESP: Decreased breath sounds at the bases bilaterally without overt adventitious sounds or wheezes GI: NABS, soft, surgical scar sites present on abdomen, right lower quadrant with a gauze pad covering the region with packing having been removed from the region showing evidence of a cutaneous lesion potentially the colocutaneous fistula, nontender, protuberant, rounded, obese, tenderness to  palpation noted in the region surrounding the apparent fistula, unable to appreciate hepatosplenomegaly due to body habitus  MSK/EXT: Trace bilateral lower extremity edema SKIN: No jaundice NEURO:  Alert & Oriented x 3, no focal deficits   REVIEW OF DATA  I reviewed the following data at the time of this encounter:  GI Procedures and Studies  No relevant studies  Laboratory Studies  Reviewed in epic  Imaging Studies  December 25, 2017 fistulogram IMPRESSION: Fistulous connection between the draining wound and the cecum, likely at the location of patient's prior cecostomy tube.  October 24, 2017 CT abdomen pelvis with contrast IMPRESSION: No abscess or fluid collection is noted currently. Probable old ostomy track seen in right lower quadrant anterior abdominal wall.  March 2019 CT abdomen pelvis with contrast IMPRESSION: 1. Skin thickening of the right anterior, lateral abdominal wall with residual edema and inflammatory change in the subcutaneous fat. Decreased gas in the subcutaneous fat but suspected residual 2.5 cm fluid collection/soft tissue abscess in the region. Fluid is contiguous with the skin surface superficially and extends to the anterior abdominal wall where there is a tiny gas bubble and suggestion of fascial defect/possible small fistula to adherent colon in the region. No significant intra-abdominal abscess is seen. 2. Enlarged fatty liver with possible subtle nodular contour. Spleen also appears slightly enlarged 3. Lumbosacral dysraphism. 4. Thick-walled lobulated bladder is suspect for neurogenic bladder. Atrophic right kidney.  January 2019 CT abdomen pelvis with contrast IMPRESSION: 1. Air and fluid-filled collection with right lower anterior abdominal wall subcutaneous fat likely connected to a subjacent tethered loop of colon by fistula. 2. Hepatic steatosis. 3. Gallbladder sludge and/or small stones. 4. Atrophic right kidney. 5. Redundant  bladder with mild wall thickening, question neurogenic bladder. 6. Complex lumbar and sacral dysraphism.   ASSESSMENT  Thomas [name] is a 22 y.o. 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  The patient is seen today for evaluation and management of:  1. Colocutaneous fistula   2. Draining cutaneous sinus tract   3. Abnormal CT of the  abdomen    This is a hemodynamically stable patient who presents for evaluation of possible advanced endoscopic closure of a presumed colocutaneous fistula.  This fistula is most likely a result of his prior cecostomy and chait tube that he had as a child and young man for issues of constipation as a result of neurogenic bowel.  At this point in time, his course been complicated by an abdominal wall infection which seems to be under better control.  Most recent fistula evaluation suggests likely cecal area for fistula being found.  We discussed the possibility of using an OVESCO clip to try and close the region.  We discussed that these are most helpful for acute fistula closure in the setting of prior PEG tubes however this is a region that if we can reach with the scope and clip in place that may be reasonable for an attempt at advanced closure.  The risks and benefits of endoscopic evaluation were discussed with the patient; these include but are not limited to the risk of perforation, infection, bleeding, missed lesions, lack of diagnosis, severe illness requiring hospitalization, as well as anesthesia and sedation related illnesses.  Although not noted with the use of the OVESCO's clip and more so with the FTRD clip there is a chance of the development of a separate fistulous tract should there be bowel or other lesions or organs in the tract region (this would be extremely rare but not impossible).  The  patient and family are agreeable to proceed with attempt at closure.  We will work on trying to schedule in the coming weeks for this.   PLAN  1. Colocutaneous fistula - CBC with Differential/Platelet; Future - Basic Metabolic Panel (BMET); Future - INR/PT; Future - Colonoscopy in 90 minute slot for attempt at OVESCO Colocutaneous fistula closure  2. Draining cutaneous sinus tract  3. Abnormal CT of the abdomen   Orders Placed This Encounter  Procedures  . CBC with Differential/Platelet  . Basic Metabolic Panel (BMET)  . INR/PT  . Ambulatory referral to Gastroenterology    New Prescriptions   No medications on file   Modified Medications   No medications on file    Planned Follow Up: No follow-ups on file.   [name], MD [facility] Advanced Endoscopy Office # [phone]

## 2018-02-12 NOTE — Patient Instructions (Addendum)
Your provider has requested that you go to the basement level for lab work before leaving today. Press "B" on the elevator. The lab is located at the first door on the left as you exit the elevator.  You have been scheduled for a colonoscopy. Please follow written instructions given to you at your visit today.  Please pick up your prep supplies at the pharmacy within the next 1-3 days. If you use inhalers (even only as needed), please bring them with you on the day of your procedure. Your physician has requested that you go to www.startemmi.com and enter the access code given to you at your visit today. This web site gives a general overview about your procedure. However, you should still follow specific instructions given to you by our office regarding your preparation for the procedure.  

## 2018-02-13 ENCOUNTER — Telehealth: Payer: Self-pay | Admitting: Gastroenterology

## 2018-02-13 NOTE — Telephone Encounter (Signed)
Patients mother states for Dr.Mansouraty to get pts [facility] records and urgent records release form can be faxed to [phone] or  [phone] and include dates between 2009-2012.

## 2018-02-15 DIAGNOSIS — L988 Other specified disorders of the skin and subcutaneous tissue: Secondary | ICD-10-CM | POA: Insufficient documentation

## 2018-02-15 DIAGNOSIS — R935 Abnormal findings on diagnostic imaging of other abdominal regions, including retroperitoneum: Secondary | ICD-10-CM | POA: Insufficient documentation

## 2018-02-15 DIAGNOSIS — K632 Fistula of intestine: Secondary | ICD-10-CM | POA: Insufficient documentation

## 2018-02-19 NOTE — Telephone Encounter (Signed)
Spoke to patients mother. She will have patient come to the office to sign Medical release form

## 2018-03-12 ENCOUNTER — Telehealth: Payer: Self-pay | Admitting: Gastroenterology

## 2018-03-12 DIAGNOSIS — N270 Small kidney, unilateral: Secondary | ICD-10-CM | POA: Diagnosis not present

## 2018-03-12 DIAGNOSIS — I10 Essential (primary) hypertension: Secondary | ICD-10-CM | POA: Diagnosis not present

## 2018-03-12 DIAGNOSIS — Z905 Acquired absence of kidney: Secondary | ICD-10-CM | POA: Diagnosis not present

## 2018-03-12 DIAGNOSIS — K632 Fistula of intestine: Secondary | ICD-10-CM

## 2018-03-12 NOTE — Telephone Encounter (Signed)
The pt's aunt called to advise Dr Meridee ScoreMansouraty that the pt has developed gas and bile coming from his abd.  This is not daily and never happened until Thanksgiving.  The last time was 12-3. He has colon scheduled for 12/16.  Can he proceed?  Please advise

## 2018-03-12 NOTE — Telephone Encounter (Signed)
Pt aunt called in about new occurring symptoms that the pt has just developed and wanting to speak with nurse. She stated that he pt has an open wound in the abdomen and dr. Asked if any bile or gas come out at the time it hasn't but has as of thanksgiving it has.

## 2018-03-12 NOTE — Telephone Encounter (Signed)
You have been scheduled for a CT scan of the abdomen and pelvis   You are scheduled on 03/15/18 at 7 am.  You should arrive 15 minutes prior to your appointment time for registration.   [facility] and pick up contrast and instructions before Wednesday.  This test typically takes 30-45 minutes to complete.  The pt has been advised and will pick up contrast and instructions.

## 2018-03-12 NOTE — Telephone Encounter (Signed)
Patty, thank you for letting me know. I put Dr. Maisie Fushomas on here as well. I suspect that it is just his drainage from the fistulous tract, but since he and family had not noted it previously, before next week (I think Monday), let's get a CT-Abdomen/Pelvis with contrast to ensure that he does not have evidence of any new fluid collections that will require other means/mechanisms of therapy. Let's get it before Friday so we can have a read before the weekend. Thanks. GM

## 2018-03-13 ENCOUNTER — Telehealth: Payer: Self-pay | Admitting: Gastroenterology

## 2018-03-13 NOTE — Telephone Encounter (Signed)
I spoke with the pt's mother and she states that the pt can not get off work Thursday for CT.  She asked if they can do it on Friday, I gave her the phone number to [place] [facility] to call and reschedule.

## 2018-03-14 ENCOUNTER — Telehealth: Payer: Self-pay | Admitting: Gastroenterology

## 2018-03-14 NOTE — Telephone Encounter (Signed)
Dr Meridee ScoreMansouraty the pt's mother has called and states that the pt is unable to get off work for the COLONOSCOPY WITH  HEMOSTASIS CLIP PLACEMENT-Ovesco Clip or the CT scan.  She wants to reschedule after the first of the year.  He was scheduled for 12/16 (your hospital week)  Is it ok to move him to January?

## 2018-03-14 NOTE — Telephone Encounter (Signed)
That works. Let me know the date so I can let the representative for OVESCO know. Thanks. GM

## 2018-03-14 NOTE — Telephone Encounter (Signed)
Patty, it is his choice. I am fine with that. When would we be looking at things for January? Thanks.

## 2018-03-14 NOTE — Telephone Encounter (Signed)
[facility]

## 2018-03-15 ENCOUNTER — Ambulatory Visit (HOSPITAL_COMMUNITY): Payer: 59

## 2018-03-15 NOTE — Telephone Encounter (Signed)
Left message on machine to call back  

## 2018-03-16 NOTE — Telephone Encounter (Signed)
Left message on machine to call back  

## 2018-03-19 ENCOUNTER — Encounter (HOSPITAL_COMMUNITY): Admission: RE | Payer: Self-pay | Source: Home / Self Care

## 2018-03-19 ENCOUNTER — Ambulatory Visit (HOSPITAL_COMMUNITY): Admission: RE | Admit: 2018-03-19 | Payer: 59 | Source: Home / Self Care | Admitting: Gastroenterology

## 2018-03-19 SURGERY — COLONOSCOPY WITH PROPOFOL
Anesthesia: Monitor Anesthesia Care

## 2018-03-19 NOTE — Telephone Encounter (Signed)
Left message on machine to call back at all available numbers  

## 2018-03-20 ENCOUNTER — Ambulatory Visit (HOSPITAL_COMMUNITY): Payer: 59

## 2018-03-20 ENCOUNTER — Other Ambulatory Visit: Payer: Self-pay

## 2018-03-20 DIAGNOSIS — K632 Fistula of intestine: Secondary | ICD-10-CM

## 2018-03-20 NOTE — Telephone Encounter (Signed)
The pt would like his mother to be called to reschedule appt. [phone]   Appt moved to 1/27 at 1015 am [facility] with Dr Meridee ScoreMansouraty the pt and his mother have been advised and instructed.    FYI:  Dr Meridee ScoreMansouraty the [facility] at 10:15 am.

## 2018-03-23 ENCOUNTER — Encounter (HOSPITAL_COMMUNITY): Payer: Self-pay

## 2018-03-23 ENCOUNTER — Ambulatory Visit (HOSPITAL_COMMUNITY)
Admission: RE | Admit: 2018-03-23 | Discharge: 2018-03-23 | Disposition: A | Discharge status: Home / Self Care | Payer: 59 | Source: Ambulatory Visit | Attending: Gastroenterology | Admitting: Gastroenterology

## 2018-03-23 DIAGNOSIS — L02211 Cutaneous abscess of abdominal wall: Secondary | ICD-10-CM | POA: Diagnosis not present

## 2018-03-23 DIAGNOSIS — K632 Fistula of intestine: Secondary | ICD-10-CM | POA: Diagnosis not present

## 2018-03-23 MED ORDER — SODIUM CHLORIDE (PF) 0.9 % IJ SOLN
INTRAMUSCULAR | Status: AC
  Filled 2018-03-23: qty 50

## 2018-03-23 MED ORDER — IOHEXOL 300 MG/ML  SOLN
100.0000 mL | Freq: Once | INTRAMUSCULAR | Status: AC | PRN
  Administered 2018-03-23: 100 mL via INTRAVENOUS

## 2018-03-26 ENCOUNTER — Encounter: Payer: Self-pay | Admitting: Gastroenterology

## 2018-03-26 ENCOUNTER — Telehealth: Payer: Self-pay | Admitting: Gastroenterology

## 2018-03-26 NOTE — Telephone Encounter (Signed)
The patient was able to be reached on his cell phone today. He describes overall feeling very well. He has been packing his wound on a daily basis and has not had significant issues unless he packs things extremely tight at which point there is an increased pressure in the area. He denies any fevers or chills. He denies any significant changes in bowel habits. The output has returned back to more of a greenish liquid rather than feculence. The patient is aware to call our office if he develops any progressive symptoms or fevers or chills at which point we would consider placing the patient back on antibiotics. If the patient continues to do well we will see him at the end of January for attempt at closure of colocutaneous fistula. Patient appreciative for call back. We will release his results via MyChart or letter to the patient.  Thomas ParishGabriel Mansouraty, MD [facility] Advanced Endoscopy Office # [phone]

## 2018-03-26 NOTE — Telephone Encounter (Signed)
Attempted call at 7:57 AM. Wanted to touch base with the patient about the results of his recent CT scan due to increasing amount of output from his previous cecostomy site. I discussed his case briefly with Dr. Maisie Fushomas his colorectal surgeon and we wanted to see how the patient was doing and query the role of being put on antibiotics again versus continued monitoring. I have left the patient a phone number to call the office and to touch base with advanced RN Doroteo GlassmanPhelps. I will attempt to call the patient back this afternoon.   Thomas ParishGabriel Mansouraty, MD [facility] Advanced [facility]oscopy Office # [phone]

## 2018-03-30 ENCOUNTER — Other Ambulatory Visit: Payer: 59

## 2018-04-23 ENCOUNTER — Telehealth: Payer: Self-pay | Admitting: Gastroenterology

## 2018-04-27 ENCOUNTER — Encounter (HOSPITAL_COMMUNITY): Payer: Self-pay | Admitting: *Deleted

## 2018-04-27 ENCOUNTER — Other Ambulatory Visit: Payer: Self-pay

## 2018-04-27 NOTE — Progress Notes (Signed)
Pt denies SOB, chest pain, and being under the care of a cardiologist. Pt denies having a stress test and cardiac cath. Pt denies having a chest x ray within the last year. Pt denies having recent labs. Pt made aware to stop taking vitamins, fish oil and herbal medications. Do not take any NSAIDs ie: Ibuprofen, Advil, Naproxen (Aleve), Motrin, BC and Goody Powder. Pt verbalized understanding of all pre-op instructions.

## 2018-04-30 ENCOUNTER — Encounter (HOSPITAL_COMMUNITY): Payer: Self-pay | Admitting: Gastroenterology

## 2018-04-30 ENCOUNTER — Encounter (HOSPITAL_COMMUNITY)
Admission: RE | Disposition: A | Discharge status: Home / Self Care | Payer: Self-pay | Source: Home / Self Care | Attending: Gastroenterology

## 2018-04-30 ENCOUNTER — Ambulatory Visit (HOSPITAL_COMMUNITY): Payer: 59 | Admitting: Certified Registered Nurse Anesthetist

## 2018-04-30 ENCOUNTER — Ambulatory Visit (HOSPITAL_COMMUNITY)
Admission: RE | Admit: 2018-04-30 | Discharge: 2018-04-30 | Disposition: A | Discharge status: Home / Self Care | Payer: 59 | Attending: Gastroenterology | Admitting: Gastroenterology

## 2018-04-30 ENCOUNTER — Telehealth: Payer: Self-pay

## 2018-04-30 ENCOUNTER — Other Ambulatory Visit: Payer: Self-pay

## 2018-04-30 DIAGNOSIS — K631 Perforation of intestine (nontraumatic): Secondary | ICD-10-CM | POA: Diagnosis not present

## 2018-04-30 DIAGNOSIS — K632 Fistula of intestine: Secondary | ICD-10-CM | POA: Diagnosis not present

## 2018-04-30 DIAGNOSIS — K644 Residual hemorrhoidal skin tags: Secondary | ICD-10-CM | POA: Diagnosis not present

## 2018-04-30 DIAGNOSIS — K648 Other hemorrhoids: Secondary | ICD-10-CM | POA: Diagnosis not present

## 2018-04-30 DIAGNOSIS — Z91040 Latex allergy status: Secondary | ICD-10-CM | POA: Diagnosis not present

## 2018-04-30 DIAGNOSIS — Z91018 Allergy to other foods: Secondary | ICD-10-CM | POA: Diagnosis not present

## 2018-04-30 DIAGNOSIS — Z882 Allergy status to sulfonamides status: Secondary | ICD-10-CM | POA: Insufficient documentation

## 2018-04-30 DIAGNOSIS — G4730 Sleep apnea, unspecified: Secondary | ICD-10-CM | POA: Diagnosis not present

## 2018-04-30 DIAGNOSIS — Z881 Allergy status to other antibiotic agents status: Secondary | ICD-10-CM | POA: Insufficient documentation

## 2018-04-30 DIAGNOSIS — Q059 Spina bifida, unspecified: Secondary | ICD-10-CM | POA: Diagnosis not present

## 2018-04-30 DIAGNOSIS — F17210 Nicotine dependence, cigarettes, uncomplicated: Secondary | ICD-10-CM | POA: Diagnosis not present

## 2018-04-30 DIAGNOSIS — Z888 Allergy status to other drugs, medicaments and biological substances status: Secondary | ICD-10-CM | POA: Diagnosis not present

## 2018-04-30 DIAGNOSIS — I10 Essential (primary) hypertension: Secondary | ICD-10-CM | POA: Diagnosis not present

## 2018-04-30 DIAGNOSIS — Z79899 Other long term (current) drug therapy: Secondary | ICD-10-CM | POA: Diagnosis not present

## 2018-04-30 DIAGNOSIS — Z885 Allergy status to narcotic agent status: Secondary | ICD-10-CM | POA: Insufficient documentation

## 2018-04-30 DIAGNOSIS — Z6841 Body Mass Index (BMI) 40.0 and over, adult: Secondary | ICD-10-CM | POA: Diagnosis not present

## 2018-04-30 HISTORY — PX: COLONOSCOPY WITH PROPOFOL: SHX5780

## 2018-04-30 HISTORY — DX: Anxiety disorder, unspecified: F41.9

## 2018-04-30 HISTORY — DX: Nausea with vomiting, unspecified: R11.2

## 2018-04-30 HISTORY — DX: Pneumonia, unspecified organism: J18.9

## 2018-04-30 HISTORY — DX: Sleep apnea, unspecified: G47.30

## 2018-04-30 HISTORY — DX: Renal tubulo-interstitial disease, unspecified: N15.9

## 2018-04-30 HISTORY — DX: Other specified postprocedural states: Z98.890

## 2018-04-30 HISTORY — PX: HOT HEMOSTASIS: SHX5433

## 2018-04-30 HISTORY — DX: Headache, unspecified: R51.9

## 2018-04-30 HISTORY — DX: Headache: R51

## 2018-04-30 SURGERY — COLONOSCOPY WITH PROPOFOL
Anesthesia: Monitor Anesthesia Care

## 2018-04-30 MED ORDER — PROPOFOL 500 MG/50ML IV EMUL
INTRAVENOUS | Status: DC | PRN
  Administered 2018-04-30: 100 ug/kg/min via INTRAVENOUS
  Administered 2018-04-30: 125 ug/kg/min via INTRAVENOUS

## 2018-04-30 MED ORDER — ONDANSETRON HCL 4 MG/2ML IJ SOLN
INTRAMUSCULAR | Status: DC | PRN
  Administered 2018-04-30: 4 mg via INTRAVENOUS

## 2018-04-30 MED ORDER — LACTATED RINGERS IV SOLN
INTRAVENOUS | Status: DC | PRN
  Administered 2018-04-30: 10:00:00 via INTRAVENOUS

## 2018-04-30 MED ORDER — SODIUM CHLORIDE 0.9 % IV SOLN: INTRAVENOUS | Status: DC

## 2018-04-30 MED ORDER — BACITRACIN ZINC 500 UNIT/GM EX OINT
TOPICAL_OINTMENT | CUTANEOUS | Status: AC
  Filled 2018-04-30: qty 1.8

## 2018-04-30 MED ORDER — PROPOFOL 10 MG/ML IV BOLUS
INTRAVENOUS | Status: DC | PRN
  Administered 2018-04-30 (×7): 30 mg via INTRAVENOUS

## 2018-04-30 MED ORDER — LIDOCAINE HCL (CARDIAC) PF 100 MG/5ML IV SOSY
PREFILLED_SYRINGE | INTRAVENOUS | Status: DC | PRN
  Administered 2018-04-30: 100 mg via INTRAVENOUS

## 2018-04-30 SURGICAL SUPPLY — 21 items

## 2018-04-30 NOTE — Op Note (Addendum)
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 Estimated Blood Loss:     Estimated blood loss was minimal. Procedure:                Pre-Anesthesia Assessment:                           - Prior to the procedure, a History and Physical                            was performed, and patient medications and                            allergies were reviewed. The patient's tolerance of                            previous anesthesia was also reviewed. The risks                            and benefits of the procedure and the sedation                            options and risks were discussed with the patient.                            All questions were answered, and informed consent                            was obtained. Prior Anticoagulants: The patient has                            taken no previous anticoagulant or antiplatelet  agents. ASA Grade Assessment: III - A patient with                            severe systemic disease. After reviewing the risks                            and benefits, the patient was deemed in                            satisfactory condition to undergo the procedure.                           After obtaining  informed consent, the colonoscope                            was passed under direct vision. Throughout the                            procedure, the patient's blood pressure, pulse, and                            oxygen saturations were monitored continuously. The                            CF-HQ190L ([id number]) Olympus colonoscope was                            introduced through the anus and advanced to the 5                            cm into the ileum. The colonoscopy was technically                            difficult and complex. The patient tolerated the                            procedure. The quality of the bowel preparation was                            evaluated using the BBPS [facility] Bowel Preparation                            Scale) with scores of: Right Colon = 3, Transverse                            Colon = 3 and Left Colon = 3 (entire mucosa seen                            well with no residual staining, small fragments of                            stool or opaque liquid). The total BBPS score  equals 9. Scope In: 10:09:23 AM Scope Out: 11:00:00 AM Scope Withdrawal Time: 0 hours 45 minutes 38 seconds  Total Procedure Duration: 0 hours 50 minutes 37 seconds  Findings:      The digital rectal exam findings include non-thrombosed external       hemorrhoids and non-thrombosed internal hemorrhoids. Pertinent negatives       include no palpable rectal lesions.      The terminal ileum and ileocecal valve appeared normal.      A medium fistula was found in the cecum. Placement of a short 0.035 inch       Soft Antonietta Breach was attempted. This passed successfully and passed through       the tract and was then visible on the cutaneous portion of the abdomen.       In order to optimze tissue healing, denuding of the tract mucosa was       performed using fulguration by argon plasma was successful using right       colon settings. The wire was left in place  and then the scope was       eventually removed. The OVESCO Clip was placed on the endoscope and then       it was reintroduced and advanced to the cecum. The OVESCO 16T 6 mm Clip       was successfully placed (MR conditional) after suctioning of the fistula       tract into the region. There was no bleeding during, or at the end, of       the procedure.      Non-bleeding non-thrombosed external and internal hemorrhoids were found       during perianal exam and during digital exam.      The rest of the colon was visualized and no gross lesions were noted,       but extensive evaluation was not performed due to the intention of       today's procedure. Impression:               - Non-thrombosed external hemorrhoids and                            non-thrombosed internal hemorrhoids found on                            digital rectal exam.                           - The examined portion of the ileum was normal.                           - Colonic fistula. Closed using OVESCO Clip.                           - Non-bleeding non-thrombosed external and internal                            hemorrhoids. Recommendation:           - The patient will be observed post-procedure,                            until all discharge criteria are  met.                           - Discharge patient to home.                           - Patient has a contact number available for                            emergencies. The signs and symptoms of potential                            delayed complications were discussed with the                            patient. Return to normal activities tomorrow.                            Written discharge instructions were provided to the                            patient.                           - Resume previous diet.                           - Repeat colonoscopy in 8-12 weeks to check healing.                           - Return to GI office in approximately 4-weeks.                            - Return to Colorectal surgery office as previously                            scheduled.                           - Would try to keep the area clean and dry overall                            as previously doing. For at least 48 hours to try                            to minimize using a wick to the region to see how                            much drainage is occuring but then may restart use                            thereafter.                           - Hopeful that drainage subsides over the next  couple of weeks. If there remains concern for                            persistent tract in the future, then would consider                            repeat fistulogram to evaluate region from the                            cutaneous portion.                           - Will consider potential timing of repeat CT to                            evaluate and see what the previously noted fluid                            collections look like.                           - The findings and recommendations were discussed                            with the patient.                           - The findings and recommendations were discussed                            with the patient's family. Procedure Code(s):        --- Professional ---                           [phone], Colonoscopy, flexible; with ablation of                            tumor(s), polyp(s), or other lesion(s) (includes                            pre- and post-dilation and guide wire passage, when                            performed) Diagnosis Code(s):        --- Professional ---                           K64.4, Residual hemorrhoidal skin tags                           K64.8, Other hemorrhoids                           K63.2, Fistula of intestine  K63.1, Perforation of intestine (nontraumatic)                           R93.3, Abnormal findings on  diagnostic imaging of                            other parts of digestive tract CPT copyright 2018 American Medical Association. All rights reserved. The codes documented in this report are preliminary and upon coder review may  be revised to meet current compliance requirements. Justice Britain, MD 04/30/2018 11:37:47 AM Number of Addenda: 0

## 2018-04-30 NOTE — Transfer of Care (Signed)
Immediate Anesthesia Transfer of Care Note  Patient: Thomas [name]  Procedure(s) Performed: COLONOSCOPY WITH PROPOFOL (N/A ) HEMOSTASIS CLIP PLACEMENT (N/A ) HOT HEMOSTASIS (ARGON PLASMA COAGULATION/BICAP) (N/A )  Patient Location: Endoscopy Unit  Anesthesia Type:MAC  Level of Consciousness: awake, alert  and oriented  Airway & Oxygen Therapy: Patient Spontanous Breathing and Patient connected to nasal cannula oxygen  Post-op Assessment: Report given to RN, Post -op Vital signs reviewed and stable and Patient moving all extremities  Post vital signs: Reviewed and stable  Last Vitals:  Vitals Value Taken Time  BP 119/78 04/30/2018 11:16 AM  Temp    Pulse 59 04/30/2018 11:19 AM  Resp 17 04/30/2018 11:19 AM  SpO2 100 % 04/30/2018 11:19 AM  Vitals shown include unvalidated device data.  Last Pain:  Vitals:   04/30/18 0910  TempSrc: Oral  PainSc: 0-No pain         Complications: No apparent anesthesia complications

## 2018-04-30 NOTE — H&P (Signed)
GASTROENTEROLOGY OUTPATIENT PROCEDURE H&P NOTE   Primary Care Physician: Iva Boop, MD  HPI: Thomas [name] is a 23 y.o. male who presents for Colonoscopy with attempt at Colocutaneous fistula closure.  Past Medical History:  Diagnosis Date  . Anxiety   . Chronic kidney infection   . Depression   . Headache   . Hypertension   . Obesity   . Pneumonia   . PONV (postoperative nausea and vomiting)   . Spina bifida [facility])    Past Surgical History:  Procedure Laterality Date  . ADENOIDECTOMY    . BACK SURGERY     Sipna Bifida repair x 2  . BLADDER SURGERY    . FRACTURE SURGERY Right    Heel  . IR SINUS/FIST TUBE CHK-NON GI  12/25/2017  . IRRIGATION AND DEBRIDEMENT ABSCESS N/A 06/05/2017   Procedure: IRRIGATION AND DEBRIDEMENT ABDOMINAL WALL ABSCESS;  Surgeon: Almond Lint, MD;  Location: [facility] ORS;  Service: General;  Laterality: N/A;  . PORT A CATH REVISION     multiple  . PORT-A-CATH REMOVAL    . STOMACH SURGERY    . TONSILLECTOMY    . WISDOM TOOTH EXTRACTION     Current Facility-Administered Medications  Medication Dose Route Frequency Provider Last Rate Last Dose  . 0.9 %  sodium chloride infusion   Intravenous Continuous Mansouraty, Netty Starring., MD       Allergies  Allergen Reactions  . Codeine Rash  . Latex Rash and Swelling    Other reaction(s): Other (See Comments) Other Reaction: mouth swells   . Lorazepam Other (See Comments)    Hallucinations Other reaction(s): Hallucinations, Other (See Comments) Other Reaction: Hallucinations   . Sulfa Antibiotics Rash  . Vancomycin Other (See Comments)    Redman's syndrome  . Food     Tropical fruit  . Ciprofloxacin Nausea Only    Other reaction(s): Other (See Comments), Vomiting chills   Family History  Problem Relation Age of Onset  . Asthma Other   . Cancer Other   . COPD Other   . Hyperlipidemia Other   . Hypertension Other   . Stroke Other   . Lupus Mother   . Sjogren's syndrome Mother   .  Sacroiliitis Mother   . Arthritis Mother        psoriatic  . Diabetes Maternal Grandfather   . Melanoma Maternal Grandfather   . Heart disease Paternal Grandfather   . Sjogren's syndrome Maternal Aunt   . Other Maternal Aunt        ITP  . Melanoma Maternal Aunt    Social History   Socioeconomic History  . Marital status: Single    Spouse name: Not on file  . Number of children: 0  . Years of education: Not on file  . Highest education level: Not on file  Occupational History  . Not on file  Social Needs  . Financial resource strain: Not on file  . Food insecurity:    Worry: Not on file    Inability: Not on file  . Transportation needs:    Medical: Not on file    Non-medical: Not on file  Tobacco Use  . Smoking status: Current Some Day Smoker    Types: Cigarettes  . Smokeless tobacco: Never Used  Substance and Sexual Activity  . Alcohol use: Yes    Comment: occasional  . Drug use: No  . Sexual activity: Not on file  Lifestyle  . Physical activity:    Days per week:  Not on file    Minutes per session: Not on file  . Stress: Not on file  Relationships  . Social connections:    Talks on phone: Not on file    Gets together: Not on file    Attends religious service: Not on file    Active member of club or organization: Not on file    Attends meetings of clubs or organizations: Not on file    Relationship status: Not on file  . Intimate partner violence:    Fear of current or ex partner: Not on file    Emotionally abused: Not on file    Physically abused: Not on file    Forced sexual activity: Not on file  Other Topics Concern  . Not on file  Social History Narrative  . Not on file    Physical Exam: Vital signs in last 24 hours:     GEN: NAD EYE: Sclerae anicteric ENT: MMM CV: RR without R/Gs  RESP: CTAB posteriorly GI: Soft, protuberant, distended, area of cutaneous fistula is noted NEURO:  Alert & Oriented x 3  Lab Results: No results for  input(s): WBC, HGB, HCT, PLT in the last 72 hours. BMET No results for input(s): NA, K, CL, CO2, GLUCOSE, BUN, CREATININE, CALCIUM in the last 72 hours. LFT No results for input(s): PROT, ALBUMIN, AST, ALT, ALKPHOS, BILITOT, BILIDIR, IBILI in the last 72 hours. PT/INR No results for input(s): LABPROT, INR in the last 72 hours.   Impression / Plan: This is a 22 y.o.male who presents for Colonoscopy with attempt at Colocutaneous fistula closure.  The risks and benefits of endoscopic evaluation were discussed with the patient; these include but are not limited to the risk of perforation, infection, bleeding, missed lesions, lack of diagnosis, severe illness requiring hospitalization, as well as anesthesia and sedation related illnesses.  The patient is agreeable to proceed.   We will attempt placement of an OVESCO Clip for closure of the defect noted on prior fistulagram.  The included risk of injury to organs in the region and formation of other fistula has been discussed previously.   Corliss ParishGabriel Mansouraty, MD [facility] Advanced [facility] Office # [phone]

## 2018-04-30 NOTE — Discharge Instructions (Signed)

## 2018-04-30 NOTE — Telephone Encounter (Signed)
Mansouraty, Netty StarringGabriel Jr., MD  Romie Leveehomas, Alicia, MD; Loretha StaplerPhelps, Davionne Dowty L, RN        Helmut MusterAlicia,  I was able to find the fistula and traverse it from the inside out with a wire.  I then burned the tract a bit to cause some granulation and placed the OVESCO clip.  I'm crossing my fingers but hopefully this is enough.  I was wondering what your thoughts would be for follow up of the previous abscess and more recent CT with fluid collections would be.  Repeat CT in 4-6 weeks?  I will bring him back for Colonoscopy in 8-12 weeks to relook at the area.  If he has recurrent or worsening output after 2-weeks I think a repeat fistulogram would be reasonable next step and then we can determine [facility].  Appreciate any other thoughts.  Thanks for the referral.   Ruchama Kubicek, please schedule this patient a follow up in clinic in 6-weeks and follow up colonoscopy in 8-12 weeks in the hospital.  Thanks.   Liz BeachGabe

## 2018-04-30 NOTE — Anesthesia Preprocedure Evaluation (Signed)
Anesthesia Evaluation  Patient identified by MRN, date of birth, ID band Patient awake    Reviewed: Allergy & Precautions, NPO status , Patient's Chart, lab work & pertinent test results  History of Anesthesia Complications (+) PONV and history of anesthetic complications  Airway Mallampati: II  TM Distance: >3 FB Neck ROM: Full    Dental no notable dental hx.    Pulmonary sleep apnea , pneumonia, Current Smoker,    Pulmonary exam normal breath sounds clear to auscultation       Cardiovascular hypertension, Normal cardiovascular exam Rhythm:Regular Rate:Normal     Neuro/Psych  Headaches, PSYCHIATRIC DISORDERS Anxiety Depression    GI/Hepatic negative GI ROS, Neg liver ROS,   Endo/Other  Morbid obesity  Renal/GU Renal disease     Musculoskeletal negative musculoskeletal ROS (+)   Abdominal   Peds  Hematology negative hematology ROS (+)   Anesthesia Other Findings   Reproductive/Obstetrics negative OB ROS                             Anesthesia Physical  Anesthesia Plan  ASA: III  Anesthesia Plan: MAC   Post-op Pain Management:    Induction: Intravenous  PONV Risk Score and Plan: 2 and Ondansetron, Dexamethasone, Treatment may vary due to age or medical condition and Propofol infusion  Airway Management Planned: Natural Airway  Additional Equipment: None  Intra-op Plan:   Post-operative Plan:   Informed Consent: I have reviewed the patients History and Physical, chart, labs and discussed the procedure including the risks, benefits and alternatives for the proposed anesthesia with the patient or authorized representative who has indicated his/her understanding and acceptance.     Dental advisory given  Plan Discussed with: CRNA  Anesthesia Plan Comments:         Anesthesia Quick Evaluation

## 2018-04-30 NOTE — Telephone Encounter (Signed)
-----   Message from Lemar Lofty., MD sent at 04/30/2018  4:27 PM EST ----- Helmut Muster, I was able to find the fistula and traverse it from the inside out with a wire. I then burned the tract a bit to cause some granulation and placed the OVESCO clip. I'm crossing my fingers but hopefully this is enough. I was wondering what your thoughts would be for follow up of the previous abscess and more recent CT with fluid collections would be. Repeat CT in 4-6 weeks? I will bring him back for Colonoscopy in 8-12 weeks to relook at the area. If he has recurrent or worsening output after 2-weeks I think a repeat fistulogram would be reasonable next step and then we can determine repeat attempt at [facility] closure. Appreciate any other thoughts. Thanks for the referral.  Enyla Lisbon, please schedule this patient a follow up in clinic in 6-weeks and follow up colonoscopy in 8-12 weeks in the hospital. Thanks.  Liz Beach

## 2018-05-01 NOTE — Anesthesia Postprocedure Evaluation (Signed)
Anesthesia Post Note  Patient: Thomas [name]  Procedure(s) Performed: COLONOSCOPY WITH PROPOFOL (N/A ) HEMOSTASIS CLIP PLACEMENT (N/A ) HOT HEMOSTASIS (ARGON PLASMA COAGULATION/BICAP) (N/A )     Patient location during evaluation: Endoscopy Anesthesia Type: MAC Level of consciousness: awake and alert Pain management: pain level controlled Vital Signs Assessment: post-procedure vital signs reviewed and stable Respiratory status: spontaneous breathing, nonlabored ventilation, respiratory function stable and patient connected to nasal cannula oxygen Cardiovascular status: stable and blood pressure returned to baseline Postop Assessment: no apparent nausea or vomiting Anesthetic complications: no    Last Vitals:  Vitals:   04/30/18 1135 04/30/18 1145  BP: 132/83 (!) 145/97  Pulse: (!) 49 (!) 58  Resp: 14 17  Temp:    SpO2: 100% 100%    Last Pain:  Vitals:   04/30/18 1145  TempSrc:   PainSc: 0-No pain                 Catalina Gravel

## 2018-05-02 ENCOUNTER — Encounter (HOSPITAL_COMMUNITY): Payer: Self-pay | Admitting: Gastroenterology

## 2018-05-03 ENCOUNTER — Telehealth: Payer: Self-pay | Admitting: Gastroenterology

## 2018-05-03 DIAGNOSIS — R935 Abnormal findings on diagnostic imaging of other abdominal regions, including retroperitoneum: Secondary | ICD-10-CM

## 2018-05-03 DIAGNOSIS — K632 Fistula of intestine: Secondary | ICD-10-CM

## 2018-05-03 DIAGNOSIS — L988 Other specified disorders of the skin and subcutaneous tissue: Secondary | ICD-10-CM

## 2018-05-03 NOTE — Telephone Encounter (Signed)
Pt's mother Thomas [name] called to inform that pt is experiencing some drainage from his procedure with Dr. Meridee Score this past Monday 04/30/18 at [facility] hosp. Pls call pt's mother.

## 2018-05-03 NOTE — Telephone Encounter (Signed)
Pts mother states he is draining from his wound, no fever, discomfort in his stomach. Drainage is creamy tan color. Please advise.

## 2018-05-03 NOTE — Telephone Encounter (Signed)
I called both the patient and mother and had to leave messages for both of them.  Within the next 10 minutes the patient's mother called back and we talked about how her son was doing.  He is having some tannish output from the region of his fistula and the patient had some mild abdominal discomfort in the region of an unclear etiology though he has had discomfort on and off.  There are no fevers or chills.  I am hopeful that as we restart wicking of the region that we find that he is just having some drainage from the areas of where he is previously had some subcutaneous fluid collections.  Hopefully this is not persistent fistulous tract drainage.  I would like the patient's mother and the patient to begin repeat wicking as they had been doing before.  Next Monday or Tuesday we will reach out to the patient/mother to see how he has been doing.  If he is continued to have drainage then I would like to proceed with a cross-sectional CT to see if we are seeing anything as suggestive of worsening or progressive fluid collections.  If this is unremarkable then I would consider the role of a potential fistulogram if things persist for the next few weeks.  The patient's mother was appreciative for the call back.  She agrees to this plan of action and will let her son know as well.  [name], MD [facility] Office # [phone]

## 2018-05-03 NOTE — Telephone Encounter (Signed)
Thomas [name], can you please let Thomas [name] or whoever is covering for her please reach out to the patient on Monday or Tuesday of next week and let me know how things are going so we can determine the need for a cross-sectional CT abdomen/pelvis.  Thank you. GM

## 2018-05-03 NOTE — Telephone Encounter (Signed)
Pts mother states he is having some drainage from

## 2018-05-04 NOTE — Telephone Encounter (Signed)
I have sent a staff message to get the pt scheduled in about 6 weeks.  Schedule not out that far yet.

## 2018-05-07 NOTE — Telephone Encounter (Signed)
Patty, Thank you for the update. Let's move forward with the patient having a CT-Abdomen/Pelvis within the next 1-1.5 weeks - this should be with IV/PO contrast. Based on this, we can determine if we need to do a fistulogram in the next few weeks as well. Thank you. No Antibiotics at this point in time.  GM

## 2018-05-07 NOTE — Telephone Encounter (Signed)
Pt is callilng with an update:  Pt has no temp and he feels like he has a "gas bubble that will not go away"   They are packing the wound and there is just as much discharge packing the wound as it was without packing it.  It is brownish and has some blood in it.

## 2018-05-07 NOTE — Telephone Encounter (Signed)
Left message on machine to call back  

## 2018-05-07 NOTE — Telephone Encounter (Signed)
Dr Meridee Score the pt's mother called in this morning with an update.  See below.

## 2018-05-08 NOTE — Telephone Encounter (Signed)
Pt's mother returned call.  Preferred contact number updated.

## 2018-05-08 NOTE — Telephone Encounter (Signed)
Pt mother called back in and stated that it is very urgent that she speaks with someone. He son is having really thick drainage w/blood and he states it feels like a really big gas bubble that wont go away.

## 2018-05-08 NOTE — Telephone Encounter (Signed)
You have been scheduled for a CT scan of the abdomen and pelvis at [facility] ([address]---this is in the same building as [facility]).   The pt's mother is calling to get a date and time due to scheduling conflicts.

## 2018-05-10 ENCOUNTER — Inpatient Hospital Stay: Admission: RE | Admit: 2018-05-10 | Payer: 59 | Source: Ambulatory Visit

## 2018-05-15 ENCOUNTER — Telehealth: Payer: Self-pay | Admitting: Gastroenterology

## 2018-05-15 NOTE — Telephone Encounter (Signed)
The pts aunt will pick up contrast here at our office tomorrow.

## 2018-05-15 NOTE — Telephone Encounter (Signed)
PT aunt called, would like to know where she has to pick up her contrast for he ct that will be done on 2-13

## 2018-05-17 ENCOUNTER — Ambulatory Visit (INDEPENDENT_AMBULATORY_CARE_PROVIDER_SITE_OTHER)
Admission: RE | Admit: 2018-05-17 | Discharge: 2018-05-17 | Disposition: A | Discharge status: Home / Self Care | Payer: 59 | Source: Ambulatory Visit | Attending: Gastroenterology | Admitting: Gastroenterology

## 2018-05-17 DIAGNOSIS — K632 Fistula of intestine: Secondary | ICD-10-CM | POA: Diagnosis not present

## 2018-05-17 DIAGNOSIS — R935 Abnormal findings on diagnostic imaging of other abdominal regions, including retroperitoneum: Secondary | ICD-10-CM | POA: Diagnosis not present

## 2018-05-17 DIAGNOSIS — K316 Fistula of stomach and duodenum: Secondary | ICD-10-CM | POA: Diagnosis not present

## 2018-05-17 DIAGNOSIS — L988 Other specified disorders of the skin and subcutaneous tissue: Secondary | ICD-10-CM

## 2018-05-17 MED ORDER — IOPAMIDOL (ISOVUE-300) INJECTION 61%
100.0000 mL | Freq: Once | INTRAVENOUS | Status: AC | PRN
  Administered 2018-05-17: 100 mL via INTRAVENOUS

## 2018-05-18 ENCOUNTER — Other Ambulatory Visit: Payer: Self-pay

## 2018-05-18 MED ORDER — AMOXICILLIN-POT CLAVULANATE 875-125 MG PO TABS: 1.0000 | ORAL_TABLET | Freq: Two times a day (BID) | ORAL | 0 refills | Status: AC

## 2018-05-22 ENCOUNTER — Telehealth: Payer: Self-pay | Admitting: Gastroenterology

## 2018-05-22 NOTE — Telephone Encounter (Signed)
Yellow- brownish drainage continues (no change) on abx (augmentin) has 4 days left.  The fistula was clamped and the pt's mother states she does not think it works.  Please advise

## 2018-05-22 NOTE — Telephone Encounter (Signed)
Pt's mother called and said "the procedure that they gave pt didn't take.  Pt was started on antibiotics."  Pt was told to call back today.

## 2018-05-22 NOTE — Telephone Encounter (Signed)
Patty, thank you for keeping me up-to-date. I was able to speak with the patient's mother and I could not get a hold of the patient himself. We discussed that he continues to have drainage from his collection site and is having to change his weight once daily.  Is not a significant amount of drainage but it does persist. I would like him to go ahead and complete the antibiotics this week. If you can reach out to him by the end of this week or on Monday of next week and if the drainage is persisting then we need to move forward with an interventional radiology fistulogram as he had earlier last year to truly evaluate whether he had a persistent fistula. If there is a persistent fistula connection then I am not sure that endoscopic therapies are going to be enough for this chronic fistula. If a surgical option is still felt to be very high risk, then I may need to send the patient to [facility] to be evaluated by the advanced biliary clinic to see if they would attempt OVESCO clip removal and either gluten/globulin and/or repeat clipping versus the possibility of suturing the region endoscopically.  I think that suturing in the area that is located would be difficult.  I also think that the thrombin or gluing would be FDA approved and not.  It would be helpful but if endoscopic therapy would need to be considered further that may be something to consider. The patient's mother was made aware of this and my concerns. We will see how the patient does during the end of the week and then proceed with fistulogram in the coming weeks if necessary. I will send this message to Dr. Maisie Fus as well.

## 2018-05-23 NOTE — Telephone Encounter (Signed)
Gabe,   I'm fine operating on him if this doesn't work.  Helmut Muster

## 2018-05-23 NOTE — Telephone Encounter (Signed)
I have sent a message to myself to call the pt on Monday and get an update.

## 2018-05-28 ENCOUNTER — Telehealth: Payer: Self-pay

## 2018-05-28 NOTE — Telephone Encounter (Signed)
I spoke with the pt's mother and she advised that the pt continues to have the same amount of drainage. Do you want to move forward with IR fistulogram?

## 2018-05-28 NOTE — Telephone Encounter (Signed)
-----   Message from Loretha Stapler, RN sent at 05/23/2018  8:27 AM EST ----- If you can reach out to him by the end of this week or on Monday of next week and if the drainage is persisting then we need to move forward with an interventional radiology fistulogram as he had earlier last year to truly evaluate whether he had a persistent fistula.

## 2018-05-28 NOTE — Telephone Encounter (Signed)
Please move forward with Fistulogram as previously ordered/performed by Dr. Maisie Fus. Thank you for update. GM

## 2018-05-28 NOTE — Telephone Encounter (Signed)
I have advised the pt's mother that a referral has been sent to [facility] to discuss fistulogram

## 2018-06-04 ENCOUNTER — Other Ambulatory Visit (HOSPITAL_BASED_OUTPATIENT_CLINIC_OR_DEPARTMENT_OTHER): Payer: Self-pay

## 2018-06-04 DIAGNOSIS — R0683 Snoring: Secondary | ICD-10-CM

## 2018-06-08 ENCOUNTER — Telehealth: Payer: Self-pay | Admitting: Gastroenterology

## 2018-06-08 MED ORDER — AMOXICILLIN-POT CLAVULANATE 875-125 MG PO TABS: 1.0000 | ORAL_TABLET | Freq: Two times a day (BID) | ORAL | 0 refills | Status: AC

## 2018-06-08 NOTE — Telephone Encounter (Signed)
Pt's mother Efraim Kaufmann called to inform that pt has a wound on his stomach that seems to be becoming into an abscess. Pls call her.

## 2018-06-08 NOTE — Telephone Encounter (Signed)
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  The skin around the area is warm to the touch and red.  Has an appointment with [facility] is 06/28/18 to discuss fistulogram. The pt states Dr Meridee Score gives him antibiotics (augmentin)  when he develops these symptoms.   Dr Meridee Score is off today please advise.

## 2018-06-08 NOTE — Telephone Encounter (Signed)
The patient has been notified of this information and all questions answered.  We have sent medications to your pharmacy for pick. 

## 2018-06-08 NOTE — Telephone Encounter (Signed)
Augmentin 875 mg bid x 10 days no refill

## 2018-07-09 ENCOUNTER — Encounter (HOSPITAL_BASED_OUTPATIENT_CLINIC_OR_DEPARTMENT_OTHER): Payer: 59

## 2018-07-23 DIAGNOSIS — I10 Essential (primary) hypertension: Secondary | ICD-10-CM | POA: Diagnosis not present

## 2018-07-31 ENCOUNTER — Ambulatory Visit: Payer: Self-pay | Admitting: General Surgery

## 2018-07-31 DIAGNOSIS — K632 Fistula of intestine: Secondary | ICD-10-CM | POA: Diagnosis not present

## 2018-07-31 NOTE — H&P (Signed)
History of Present Illness Thomas Levee MD; 07/31/2018 11:33 AM) The patient is a 23 year old male who presents with a complaint of Fistula. 23 year old male who presents to the office with a persistent right lower quadrant fistula. Patient has a history of an ACE procedure for chronic constipation done when he was 23 years old. At this time he also had a bladder procedure. This was all done through an open midline incision. Approximately a year later his site closed and he underwent fluoroscopic cecostomy tube placement. He kept this in for several years but was then able to get on a good bowel regimen and the cecostomy tube was pulled. He did not have any further trouble until January 2019. He developed a right lower quadrant abscess which was opened and drained. The abscess subsequently healed but then recurred several months later. It has been a persistent fistula since then. CT scan shows communication with the cecum. He underwent an attempted endoscopic procedure to close the fistula internally. This did not resolve his symptoms. He is here today for further evaluation. He continues to have feculent drainage on a daily basis. He is packing the wound to keep it open and drained. He has been on antibiotics intermittently.   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 Atenolol (100MG  Tablet, Oral) Active. HydroCHLOROthiazide (25MG  Tablet, Oral) Active. Medications Reconciled  Social History Thomas Levee, MD;  07/31/2018 11:34 AM) Non-Contributory Social History  Family History Thomas Levee, MD; 07/31/2018 11:34 AM) Non-Contributory Family History     Review of Systems Thomas Levee MD; 07/31/2018 11:35 AM) All other systems negative  Vitals Tresa Endo Dockery LPN; 5/46/5035 46:56 AM) 07/31/2018 11:19 AM Weight: 392 lb Height: 70in Body Surface Area: 2.78 m Body Mass Index: 56.25 kg/m  Temp.: 98.65F(Oral)  Pulse: 100 (Regular)  BP: 142/96 (Sitting, Left Arm, Standard)      Physical Exam Thomas Levee MD; 07/31/2018 11:34 AM)  General Mental Status-Alert. General Appearance-Consistent with stated age. Hydration-Well hydrated. Voice-Normal.  Head and Neck Head-normocephalic, atraumatic with no lesions or palpable masses.  Eye Sclera/Conjunctiva - Bilateral-No scleral icterus.  Chest and Lung Exam Chest and lung exam reveals -quiet, even and easy respiratory effort with no use of accessory muscles. Inspection Chest Wall - Normal. Back - normal.  Cardiovascular Cardiovascular examination reveals -normal pedal pulses bilaterally. Note:regular rate and rhythm  Abdomen Inspection-Inspection Normal. Palpation/Percussion Palpation and Percussion of the abdomen reveal - Soft, Non Tender, No Rebound tenderness, No Rigidity (guarding) and No hepatosplenomegaly. Note: granulation tissue at borders of wound in RLQ. purulent drainage. can probe wound easily to 6 cm.  Peripheral Vascular Upper Extremity Inspection - Bilateral - Normal - No Clubbing, No Cyanosis, No Edema, Pulses Intact. Lower Extremity Palpation - Edema - Bilateral - No edema.  Neurologic Neurologic evaluation reveals -alert and oriented x 3 with no impairment of recent or remote memory. Mental Status-Normal.  Musculoskeletal Global Assessment -Note:no gross deformities.  Normal Exam - Left-Upper Extremity Strength Normal and Lower Extremity Strength Normal. Normal  Exam - Right-Upper Extremity Strength Normal and Lower Extremity Strength Normal.    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 We have tried endoscopic closure with no success. He continues to have fecal drainage on a daily basis. He is packing the wound. He is currently off antibiotics and his exam does not show any signs of infection or cellulitis. He does continue to have feculent drainage. We will plan on doing a laparoscopic closure of his colocutaneous fistula within the next few months. Risk of surgery were discussed with patient which include postoperative ileus, damage to adjacent structures, need for additional hospitalization, infection, bleeding, hernia. There is also a small risk of recurrent fistula. I believe he understands this and wishes to proceed with surgery. We discussed that this could be an outpatient procedure but that if quite a bit of dissection was needed, we would keep him inpatient.

## 2018-08-01 DIAGNOSIS — E669 Obesity, unspecified: Secondary | ICD-10-CM | POA: Diagnosis not present

## 2018-08-01 DIAGNOSIS — Z905 Acquired absence of kidney: Secondary | ICD-10-CM | POA: Diagnosis not present

## 2018-08-01 DIAGNOSIS — I10 Essential (primary) hypertension: Secondary | ICD-10-CM | POA: Diagnosis not present

## 2018-08-23 DIAGNOSIS — L0390 Cellulitis, unspecified: Secondary | ICD-10-CM | POA: Diagnosis not present

## 2018-08-25 DIAGNOSIS — I10 Essential (primary) hypertension: Secondary | ICD-10-CM | POA: Diagnosis not present

## 2018-08-25 DIAGNOSIS — M86171 Other acute osteomyelitis, right ankle and foot: Secondary | ICD-10-CM | POA: Diagnosis not present

## 2018-08-25 DIAGNOSIS — E876 Hypokalemia: Secondary | ICD-10-CM | POA: Diagnosis not present

## 2018-08-26 DIAGNOSIS — S91301A Unspecified open wound, right foot, initial encounter: Secondary | ICD-10-CM | POA: Diagnosis not present

## 2018-08-26 DIAGNOSIS — M86171 Other acute osteomyelitis, right ankle and foot: Secondary | ICD-10-CM | POA: Diagnosis not present

## 2018-08-26 DIAGNOSIS — M79671 Pain in right foot: Secondary | ICD-10-CM | POA: Diagnosis not present

## 2018-08-27 DIAGNOSIS — M86171 Other acute osteomyelitis, right ankle and foot: Secondary | ICD-10-CM | POA: Diagnosis not present

## 2018-08-28 DIAGNOSIS — M86171 Other acute osteomyelitis, right ankle and foot: Secondary | ICD-10-CM | POA: Diagnosis not present

## 2018-08-30 DIAGNOSIS — M86171 Other acute osteomyelitis, right ankle and foot: Secondary | ICD-10-CM | POA: Diagnosis not present

## 2018-09-03 ENCOUNTER — Other Ambulatory Visit
Admission: RE | Admit: 2018-09-03 | Discharge: 2018-09-03 | Disposition: A | Discharge status: Home / Self Care | Payer: 59 | Source: Ambulatory Visit | Attending: Infectious Diseases | Admitting: Infectious Diseases

## 2018-09-03 DIAGNOSIS — L089 Local infection of the skin and subcutaneous tissue, unspecified: Secondary | ICD-10-CM | POA: Diagnosis present

## 2018-09-03 DIAGNOSIS — M86171 Other acute osteomyelitis, right ankle and foot: Secondary | ICD-10-CM | POA: Insufficient documentation

## 2018-09-03 LAB — COMPREHENSIVE METABOLIC PANEL
ALT: 37 U/L (ref 0–44)
AST: 32 U/L (ref 15–41)
Albumin: 3.6 g/dL (ref 3.5–5.0)
Alkaline Phosphatase: 69 U/L (ref 38–126)
Anion gap: 10 (ref 5–15)
BUN: 17 mg/dL (ref 6–20)
CO2: 25 mmol/L (ref 22–32)
Calcium: 8.9 mg/dL (ref 8.9–10.3)
Chloride: 105 mmol/L (ref 98–111)
Creatinine, Ser: 1.21 mg/dL (ref 0.61–1.24)
GFR calc Af Amer: >60 mL/min (ref >60)
GFR calc non Af Amer: >60 mL/min (ref >60)
Glucose, Bld: 104 mg/dL — ABNORMAL HIGH (ref 70–99)
Potassium: 4.1 mmol/L (ref 3.5–5.1)
Sodium: 140 mmol/L (ref 135–145)
Total Bilirubin: 0.5 mg/dL (ref 0.3–1.2)
Total Protein: 6.9 g/dL (ref 6.5–8.1)

## 2018-09-03 LAB — VANCOMYCIN, TROUGH: Vancomycin Tr: 14 ug/mL — ABNORMAL LOW (ref 15–20)

## 2018-09-25 ENCOUNTER — Other Ambulatory Visit
Admission: RE | Admit: 2018-09-25 | Discharge: 2018-09-25 | Disposition: A | Discharge status: Home / Self Care | Payer: 59 | Source: Ambulatory Visit | Attending: Infectious Diseases | Admitting: Infectious Diseases

## 2018-09-25 DIAGNOSIS — L089 Local infection of the skin and subcutaneous tissue, unspecified: Secondary | ICD-10-CM | POA: Diagnosis present

## 2018-09-25 DIAGNOSIS — M86171 Other acute osteomyelitis, right ankle and foot: Secondary | ICD-10-CM | POA: Diagnosis not present

## 2018-09-25 LAB — CBC WITH DIFFERENTIAL/PLATELET
Abs Immature Granulocytes: 0.02 10*3/uL (ref 0.00–0.07)
Basophils Absolute: 0.1 10*3/uL (ref 0.0–0.1)
Basophils Relative: 1 %
Eosinophils Absolute: 0.5 10*3/uL (ref 0.0–0.5)
Eosinophils Relative: 6 %
HCT: 46.9 % (ref 39.0–52.0)
Hemoglobin: 14.7 g/dL (ref 13.0–17.0)
Immature Granulocytes: 0 %
Lymphocytes Relative: 23 %
Lymphs Abs: 1.9 10*3/uL (ref 0.7–4.0)
MCH: 27.2 pg (ref 26.0–34.0)
MCHC: 31.3 g/dL (ref 30.0–36.0)
MCV: 86.7 fL (ref 80.0–100.0)
Monocytes Absolute: 0.6 10*3/uL (ref 0.1–1.0)
Monocytes Relative: 7 %
Neutro Abs: 5.2 10*3/uL (ref 1.7–7.7)
Neutrophils Relative %: 63 %
Platelets: 249 10*3/uL (ref 150–400)
RBC: 5.41 MIL/uL (ref 4.22–5.81)
RDW: 14.4 % (ref 11.5–15.5)
WBC: 8.2 10*3/uL (ref 4.0–10.5)
nRBC: 0 % (ref 0.0–0.2)

## 2018-09-25 LAB — COMPREHENSIVE METABOLIC PANEL
ALT: 30 U/L (ref 0–44)
AST: 33 U/L (ref 15–41)
Albumin: 3.8 g/dL (ref 3.5–5.0)
Alkaline Phosphatase: 48 U/L (ref 38–126)
Anion gap: 12 (ref 5–15)
BUN: 13 mg/dL (ref 6–20)
CO2: 25 mmol/L (ref 22–32)
Calcium: 9.1 mg/dL (ref 8.9–10.3)
Chloride: 102 mmol/L (ref 98–111)
Creatinine, Ser: 1.07 mg/dL (ref 0.61–1.24)
GFR calc Af Amer: >60 mL/min (ref >60)
GFR calc non Af Amer: >60 mL/min (ref >60)
Glucose, Bld: 84 mg/dL (ref 70–99)
Potassium: 3.7 mmol/L (ref 3.5–5.1)
Sodium: 139 mmol/L (ref 135–145)
Total Bilirubin: 0.5 mg/dL (ref 0.3–1.2)
Total Protein: 6.6 g/dL (ref 6.5–8.1)

## 2018-09-29 ENCOUNTER — Other Ambulatory Visit
Admission: RE | Admit: 2018-09-29 | Discharge: 2018-09-29 | Disposition: A | Discharge status: Home / Self Care | Payer: 59 | Source: Ambulatory Visit | Attending: Infectious Diseases | Admitting: Infectious Diseases

## 2018-09-29 DIAGNOSIS — M86171 Other acute osteomyelitis, right ankle and foot: Secondary | ICD-10-CM | POA: Insufficient documentation

## 2018-09-29 LAB — COMPREHENSIVE METABOLIC PANEL
ALT: 29 U/L (ref 0–44)
AST: 27 U/L (ref 15–41)
Albumin: 3.5 g/dL (ref 3.5–5.0)
Alkaline Phosphatase: 52 U/L (ref 38–126)
Anion gap: 9 (ref 5–15)
BUN: 19 mg/dL (ref 6–20)
CO2: 25 mmol/L (ref 22–32)
Calcium: 9 mg/dL (ref 8.9–10.3)
Chloride: 106 mmol/L (ref 98–111)
Creatinine, Ser: 0.96 mg/dL (ref 0.61–1.24)
GFR calc Af Amer: >60 mL/min (ref >60)
GFR calc non Af Amer: >60 mL/min (ref >60)
Glucose, Bld: 74 mg/dL (ref 70–99)
Potassium: 4.2 mmol/L (ref 3.5–5.1)
Sodium: 140 mmol/L (ref 135–145)
Total Bilirubin: 0.6 mg/dL (ref 0.3–1.2)
Total Protein: 6.6 g/dL (ref 6.5–8.1)

## 2019-07-08 ENCOUNTER — Encounter: Payer: Self-pay | Admitting: Registered"

## 2019-08-08 ENCOUNTER — Other Ambulatory Visit: Payer: Self-pay

## 2019-08-08 ENCOUNTER — Encounter: Payer: 59 | Attending: Internal Medicine | Admitting: Registered"

## 2019-08-08 ENCOUNTER — Encounter: Payer: Self-pay | Admitting: Registered"

## 2019-08-08 DIAGNOSIS — E669 Obesity, unspecified: Secondary | ICD-10-CM | POA: Insufficient documentation

## 2019-08-08 NOTE — Patient Instructions (Signed)
-   Aim to have 3 meals a day.   - Try to have 5 food groups with each meal. See handout.  - Increase fiber intake with whole grain options, nuts, fruits, and vegetables.   - Aim to increase movement to 30 min, 2-3 days/week.

## 2019-08-08 NOTE — Progress Notes (Signed)
  Medical Nutrition Therapy:  Appt start time: 9:55 end time:  10:48.   Assessment:  Primary concerns today: Pt arrives stating he wants to lose weight to have surgery on the side of his stomach. States he has an infected hole that needs to be repaired.   Pt expectations: solidarity in moving forward, something he can commit to  Pt reports surgical history involving part of his small intestine being attached to bladder for bladder enlargement. 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 States he didn't have bowel or bladder control until 5th grade. Reports he currently is unable to feel anything below the knee.   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   Reports most days he doesn't feel hungry for lunch and will skip until dinner. States girlfriend  typically  cooks dinner. States he doesn't have much of an appetite. Some days he eats more than he should in one sitting.   Plays with 2 dogs for physical activity. Works for [employer].    Preferred Learning Style:   No preference indicated   Learning Readiness:   Ready  Change in progress   MEDICATIONS: See list   DIETARY INTAKE:  Usual eating pattern includes 1 meals and 0 snacks per day.  Everyday foods include watermelon, apples, oranges, grapes, frozen meals, vegetables, salad, noodles, chicken, and fish. Avoided foods include mushrooms, raw carrots, and olives.    24-hr recall:  B ( AM): typically skips  Snk ( AM):   L ( PM): sometimes skips; frozen meal Snk ( PM):  D ( PM): 3/4 cup of Ramen noodles  Snk ( PM):  Beverages: ice and water (6*30 oz), blue gatorade/powerade sometimes  Usual physical activity: plays with dogs  Estimated energy needs: 2400 calories 270 g carbohydrates 180 g protein 67 g fat  Progress Towards Goal(s):  In progress.   Nutritional Diagnosis:  NB-1.1 Food and nutrition-related knowledge deficit As related to skipping  meals.  As evidenced by dietary recall.    Intervention:  Nutrition education and counseling. Pt was educated and counseled on the benefits of eating throughout the day, metabolism, eating to fuel the body, and how skipping meals affects weight. Pt was in agreement with goal listed. Goals: - Aim to have 3 meals a day.  - Try to have 5 food groups with each meal. See handout. - Increase fiber intake with whole grain options, nuts, fruits, and vegetables.  - Aim to increase movement to 30 min, 2-3 days/week.   Teaching Method Utilized:  Visual Auditory Hands on  Handouts given during visit include:  My Plate for adults  Barriers to learning/adherence to lifestyle change: contemplative stage of change  Demonstrated degree of understanding via:  Teach Back   Monitoring/Evaluation:  Dietary intake, exercise, and body weight in 1 month(s).

## 2019-08-09 ENCOUNTER — Encounter (HOSPITAL_COMMUNITY): Payer: Self-pay | Admitting: Gastroenterology

## 2019-08-13 IMAGING — CT CT ABD-PELV W/ CM
2 of 4 series · 16 of 46 positions shown, 18 images · IV contrast (APPLIED)
Comparison: CT 04/19/2017, ultrasound 04/19/2017

CLINICAL DATA: Soft tissue drainage and swelling

EXAM:
CT ABDOMEN AND PELVIS WITH CONTRAST
TECHNIQUE: Multidetector CT imaging of the abdomen and pelvis was performed
using the standard protocol following bolus administration of
intravenous contrast.
CONTRAST:  100mL 34FGIU-4ZZ IOPAMIDOL (34FGIU-4ZZ) INJECTION 61%

[Series 2: axial st · axial · 0.98mm/px · z∈[-536,-101]mm · 13 of 101 slices shown, 15 images]
[im 7/101  soft-tissue]
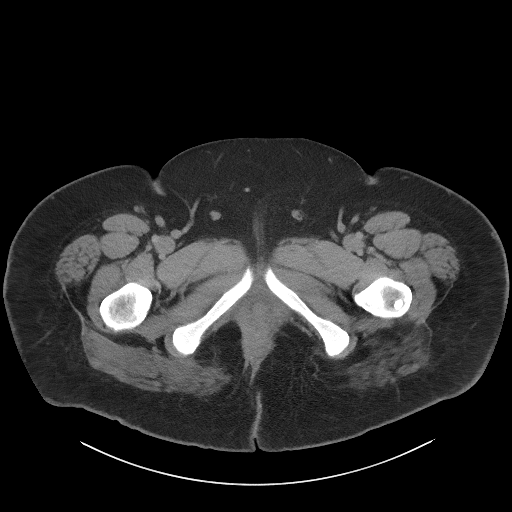
[im 7/101  bone]
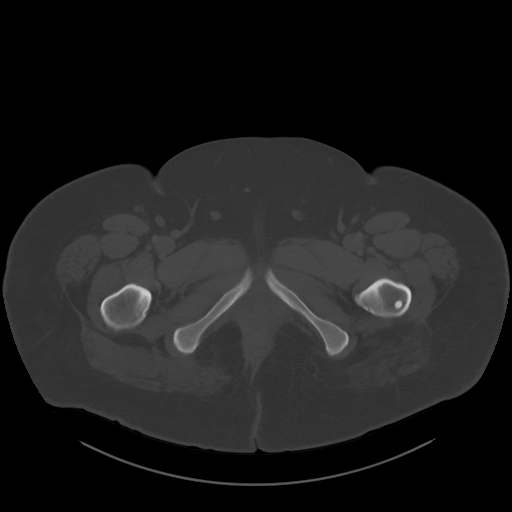
[im 13/101  soft-tissue]
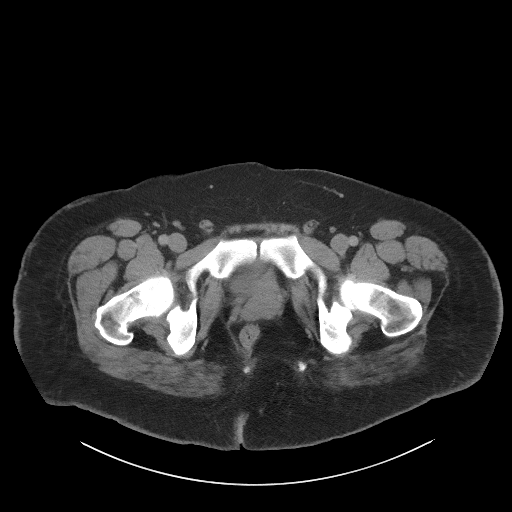
[im 19/101  soft-tissue]
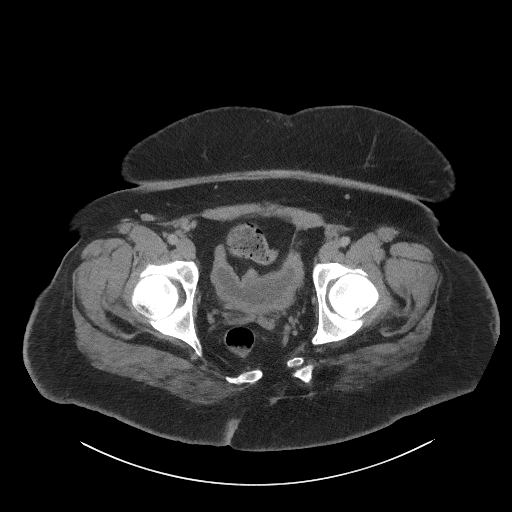
[im 32/101  soft-tissue]
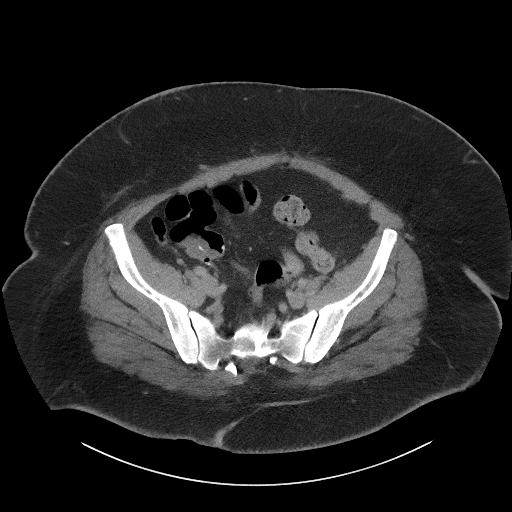
[im 38/101  soft-tissue]
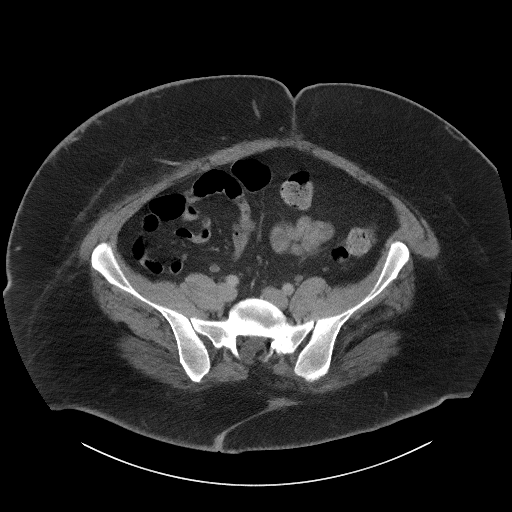
[im 44/101  soft-tissue]
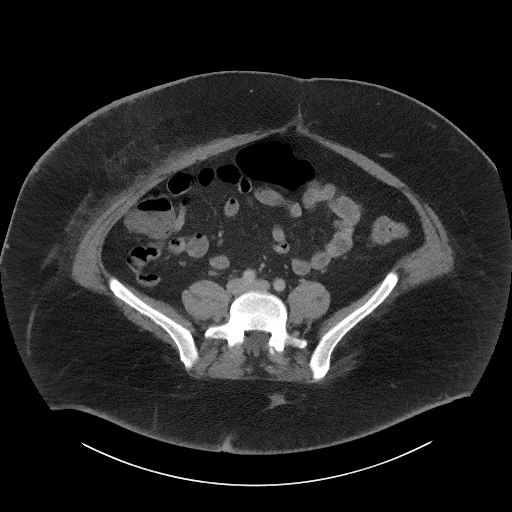
[im 51/101  soft-tissue]
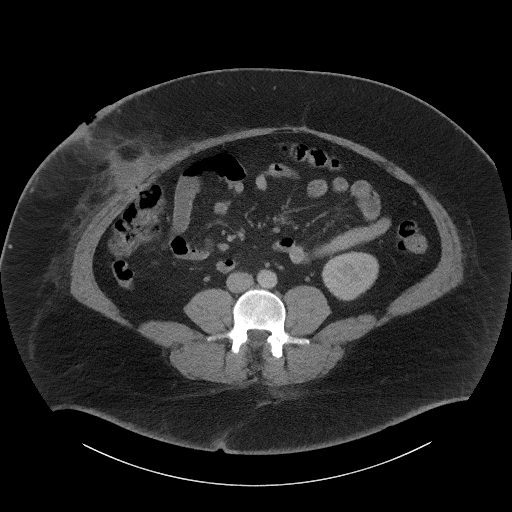
[im 57/101  soft-tissue]
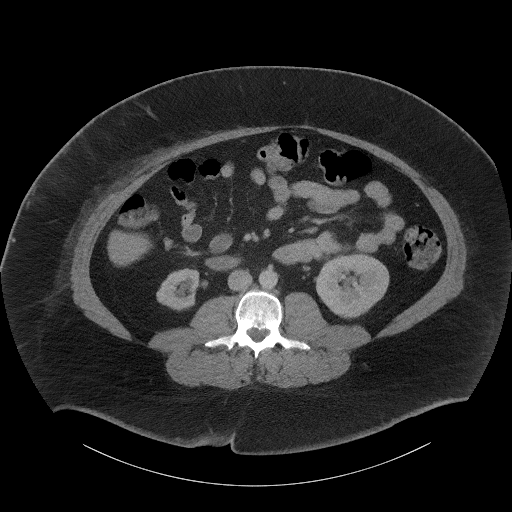
[im 63/101  soft-tissue]
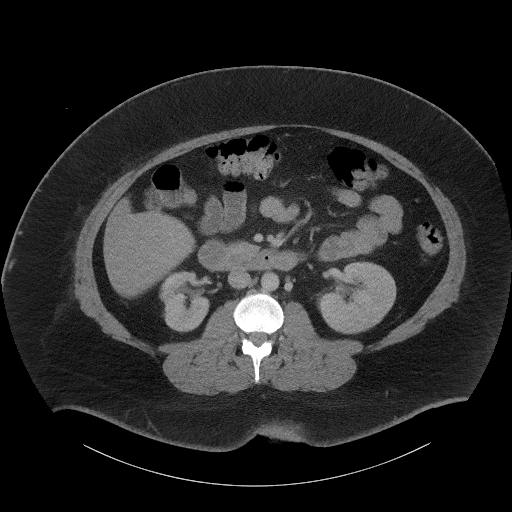
[im 63/101  bone]
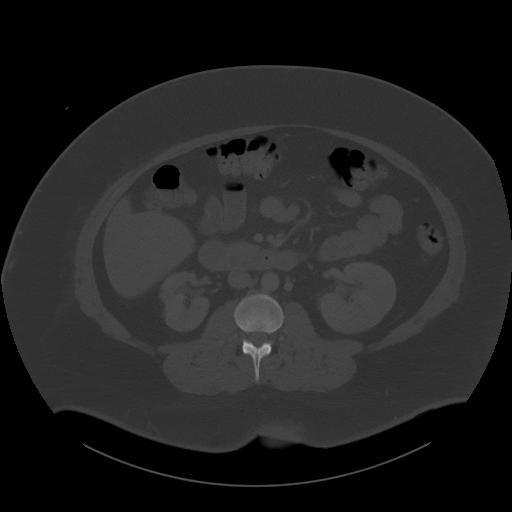
[im 69/101  soft-tissue]
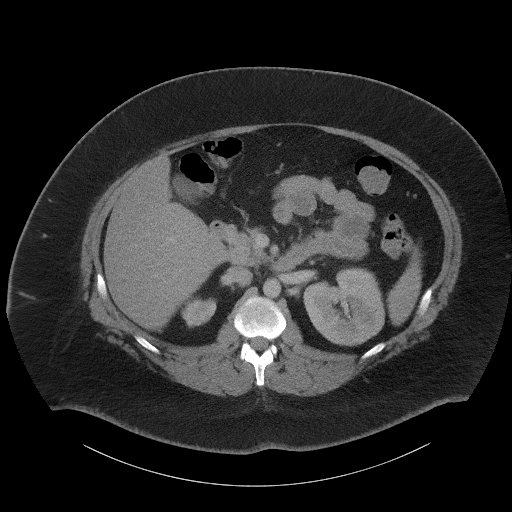
[im 82/101  soft-tissue]
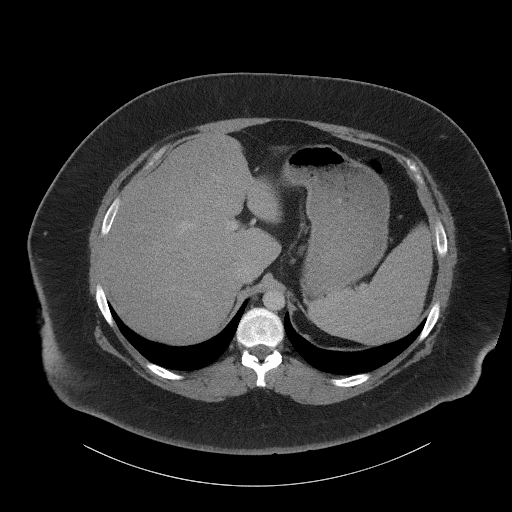
[im 88/101  soft-tissue]
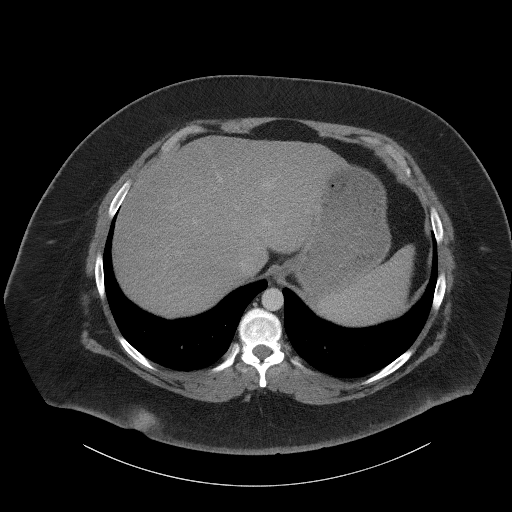
[im 94/101  soft-tissue]
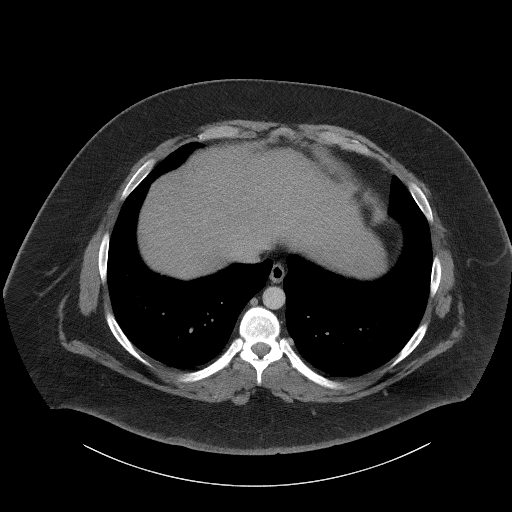

[Series 5: coronal st · coronal · 1.01mm/px · 3 of 123 slices shown]
[im 41/123  soft-tissue]
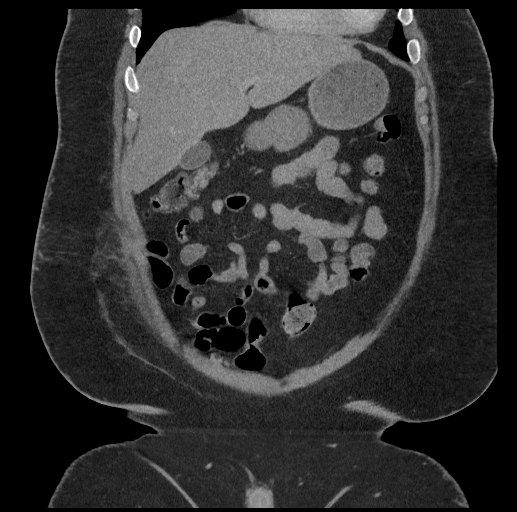
[im 55/123  soft-tissue]
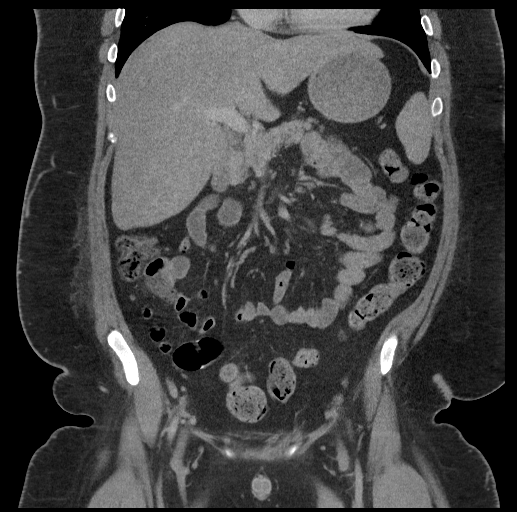
[im 68/123  soft-tissue]
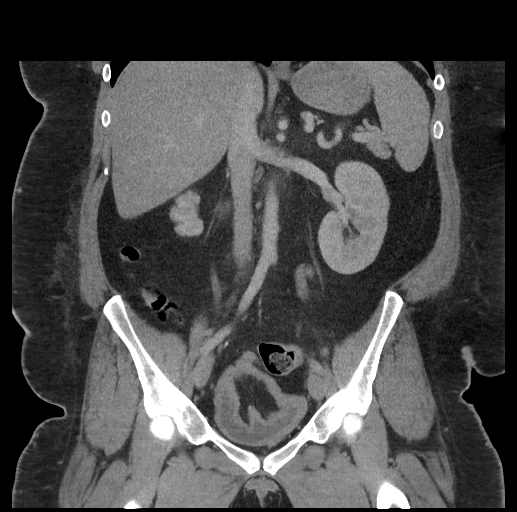

[16 of 46 positions shown; findings below may reference images not displayed]

FINDINGS: Lower chest: Lung bases demonstrate no acute consolidation or
pleural effusion. Normal heart size.

Hepatobiliary: Enlarged liver measuring 20 cm craniocaudad. Possible
subtle nodularity to the contour. Steatosis. No calcified gallstones
or biliary dilatation

Pancreas: Unremarkable. No pancreatic ductal dilatation or
surrounding inflammatory changes.

Spleen: Slightly enlarged at 15 cm.

Adrenals/Urinary Tract: Adrenal glands are within normal limits.
Left kidney is normal. Atrophic scarred right kidney. Lobulated
thick-walled urinary bladder.

Stomach/Bowel: Stomach nonenlarged. No dilated small bowel. No colon
wall thickening.

Vascular/Lymphatic: Nonaneurysmal aorta. No significantly enlarged
lymph nodes.

Reproductive: Prostate is unremarkable.

Other: Negative for free air or free fluid. Right abdominal wall
skin thickening with infiltration of the fat consistent with
inflammation or infection. Decrease gas collection in the
subcutaneous fat. Small residual fluid collection within the
subcutaneous fat measuring 2.5 cm with small fascial defect and
adjacent gas bubble of the right lateral anterior abdominal wall.
Decreased intra-abdominal collection since prior CT. Tethered
appearance of the right colon to the anterior abdominal wall.

Musculoskeletal: Lumbar and sacral dysraphism.
IMPRESSION: 1. Skin thickening of the right anterior, lateral abdominal wall
with residual edema and inflammatory change in the subcutaneous fat.
Decreased gas in the subcutaneous fat but suspected residual 2.5 cm
fluid collection/soft tissue abscess in the region. Fluid is
contiguous with the skin surface superficially and extends to the
anterior abdominal wall where there is a tiny gas bubble and
suggestion of fascial defect/possible small fistula to adherent
colon in the region. No significant intra-abdominal abscess is seen.
2. Enlarged fatty liver with possible subtle nodular contour. Spleen
also appears slightly enlarged
3. Lumbosacral dysraphism.
4. Thick-walled lobulated bladder is suspect for neurogenic bladder.
Atrophic right kidney.

## 2019-09-11 ENCOUNTER — Ambulatory Visit: Payer: 59 | Admitting: Registered"

## 2020-05-31 IMAGING — CT CT ABD-PELV W/ CM
2 of 4 series · 16 of 46 positions shown, 18 images · IV contrast (omnipaque)
Comparison: 10/24/2017

CLINICAL DATA: Colocutaneous fistula. Recent abdominal wall
abscess.

EXAM:
CT ABDOMEN AND PELVIS WITH CONTRAST
TECHNIQUE: Multidetector CT imaging of the abdomen and pelvis was performed
using the standard protocol following bolus administration of
intravenous contrast.
CONTRAST:  100mL OMNIPAQUE IOHEXOL 300 MG/ML  SOLN

[Series 2: axial st · axial · 0.98mm/px · z∈[-546,-56]mm · 13 of 112 slices shown, 15 images]
[im 7/112  soft-tissue]
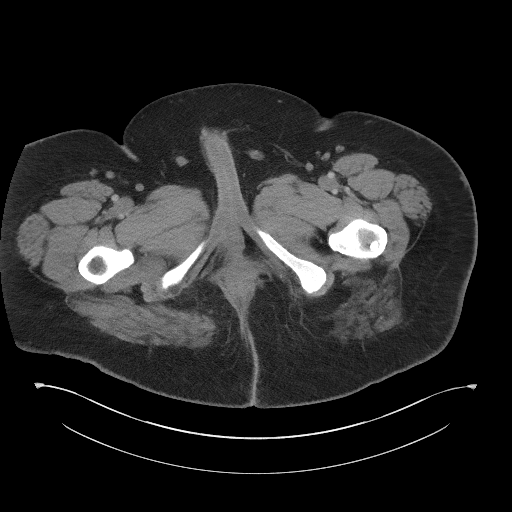
[im 7/112  bone]
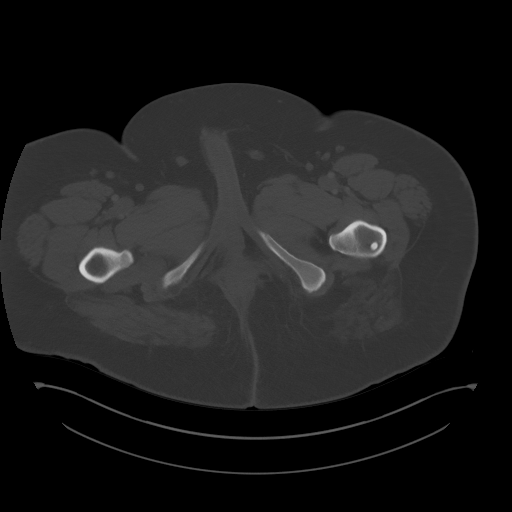
[im 14/112  soft-tissue]
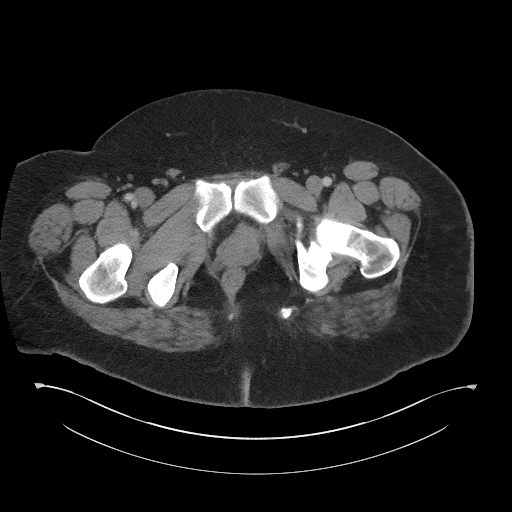
[im 27/112  soft-tissue]
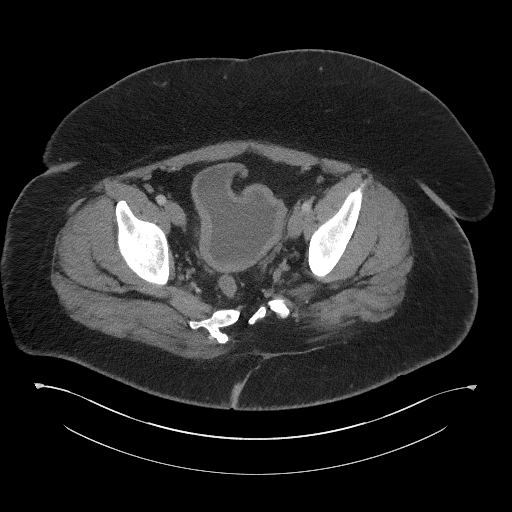
[im 33/112  soft-tissue]
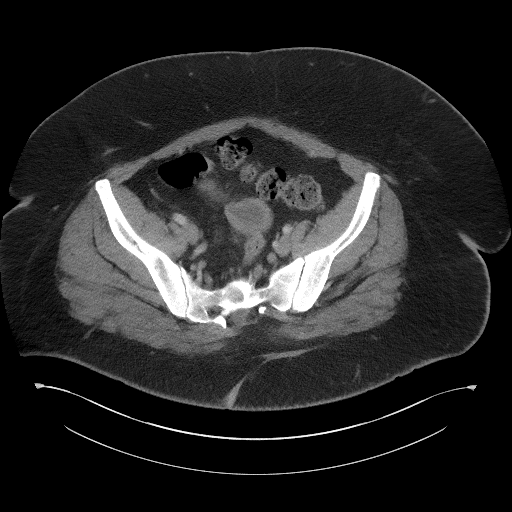
[im 40/112  soft-tissue]
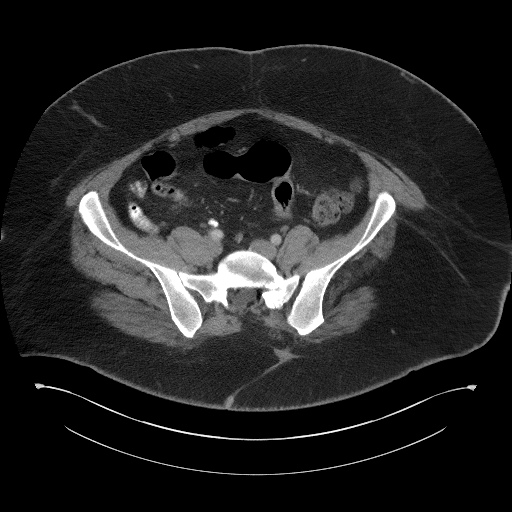
[im 46/112  soft-tissue]
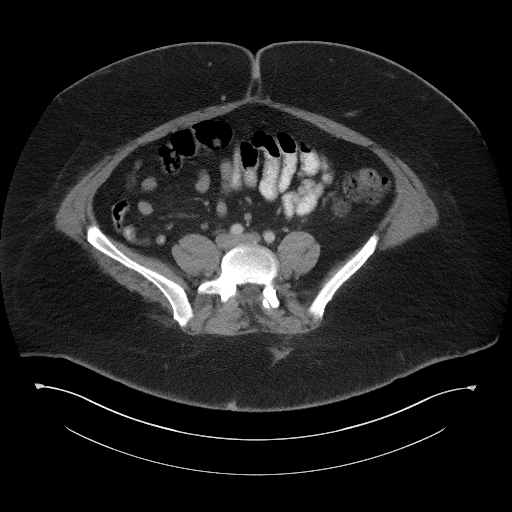
[im 59/112  soft-tissue]
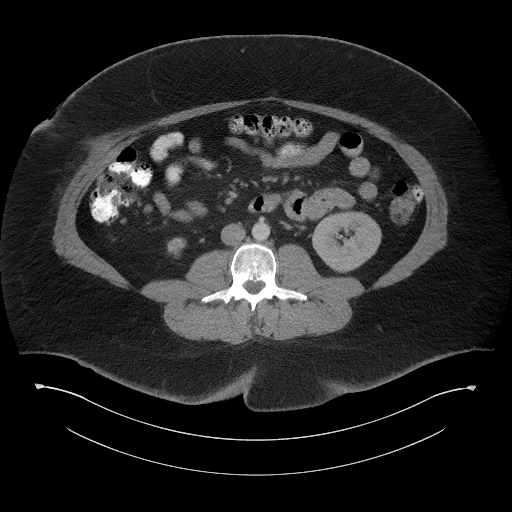
[im 66/112  soft-tissue]
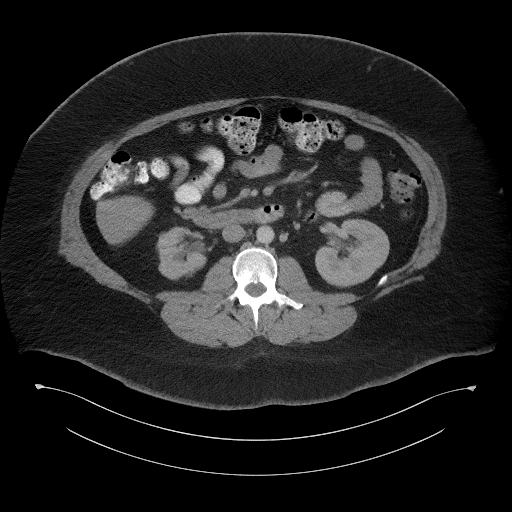
[im 72/112  soft-tissue]
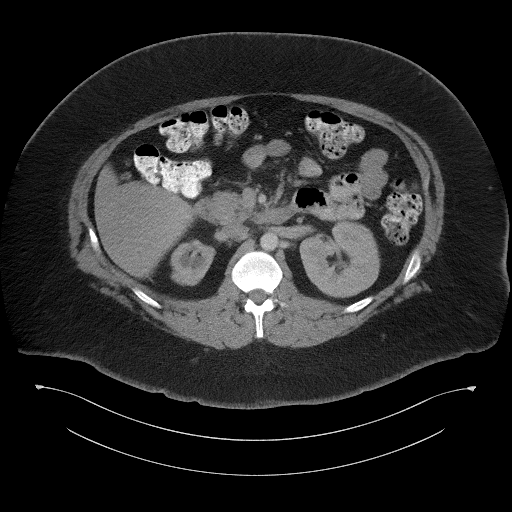
[im 72/112  bone]
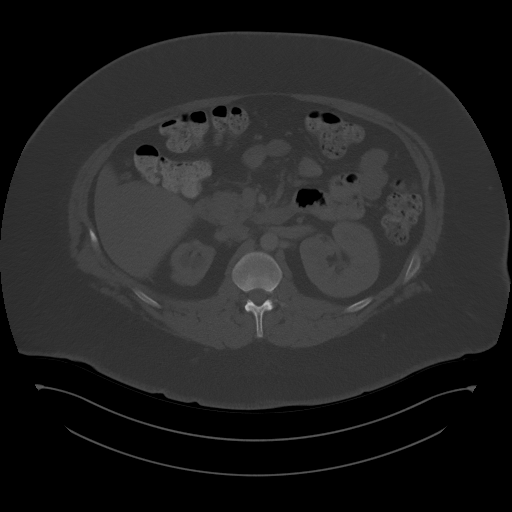
[im 79/112  soft-tissue]
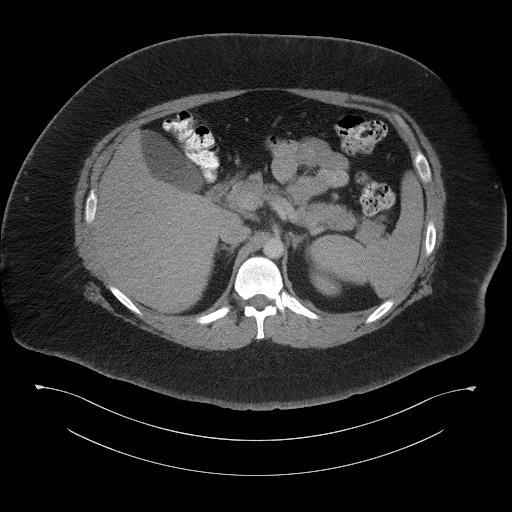
[im 85/112  soft-tissue]
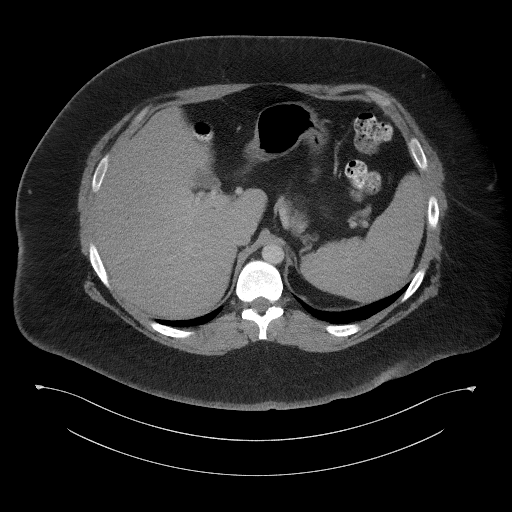
[im 98/112  soft-tissue]
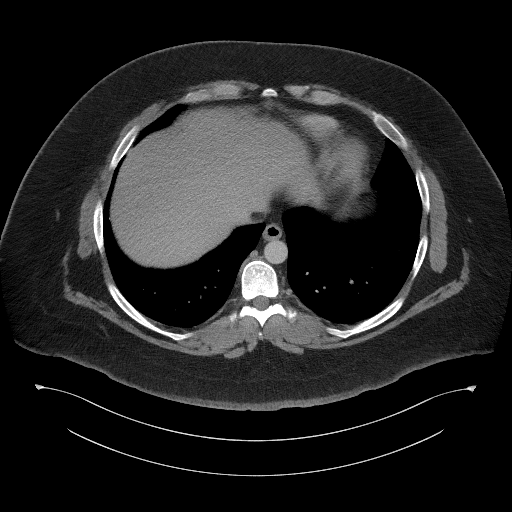
[im 105/112  soft-tissue]
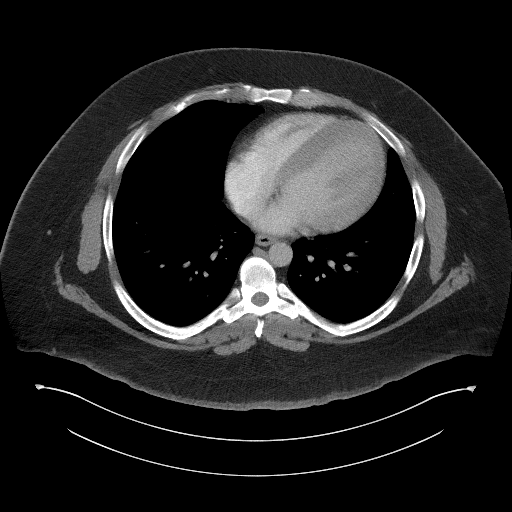

[Series 5: coronal st · coronal · 0.86mm/px · 3 of 134 slices shown]
[im 45/134  soft-tissue]
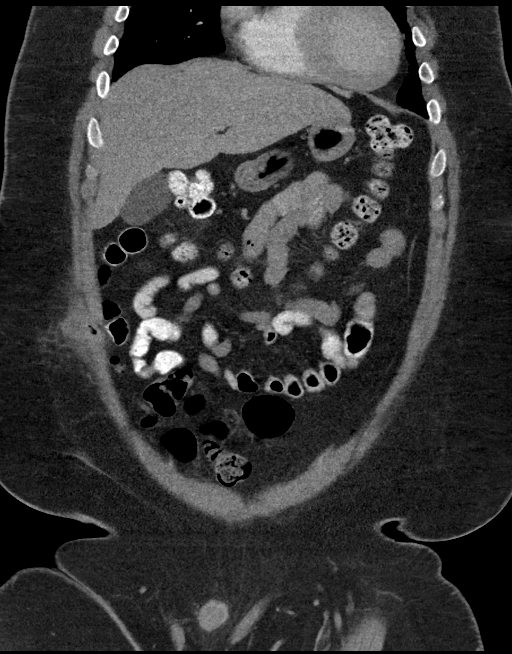
[im 60/134  soft-tissue]
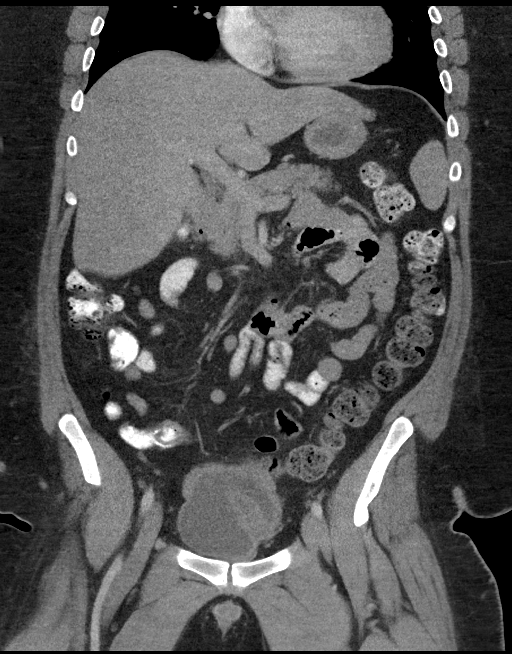
[im 74/134  soft-tissue]
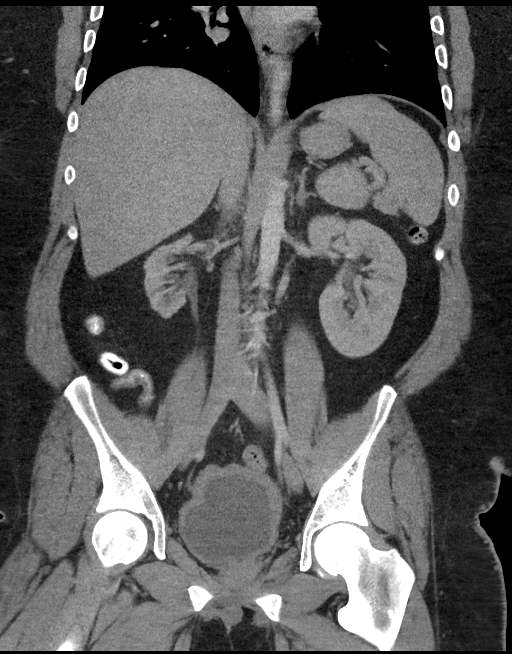

[16 of 46 positions shown; findings below may reference images not displayed]

FINDINGS: Lower Chest: No acute findings.

Hepatobiliary: No hepatic masses identified. Gallbladder is
unremarkable.

Pancreas:  No mass or inflammatory changes.

Spleen: Within normal limits in size and appearance.

Adrenals/Urinary Tract: Normal appearance of left kidney and adrenal
glands. Chronic right renal parenchymal scarring and atrophy is
again seen. Stable appearance of surgically enlarged urinary
bladder.

Stomach/Bowel: A colocutaneous fistula is seen in the right anterior
abdominal wall arising from the right colon. A small amount of gas
is seen within the fistulous tract. There is also a small
low-attenuation area just external to the abdominal wall muscles,
which measures 2.2 x 1.8 cm and is suspicious for small abscess.

Vascular/Lymphatic: No pathologically enlarged lymph nodes. No
abdominal aortic aneurysm.

Reproductive:  No mass or other significant abnormality.

Other:  None.

Musculoskeletal: No suspicious bone lesions identified. Lumbosacral
spina bifida again noted.
IMPRESSION: Colocutaneous fistula in the right anterior abdominal wall arising
from the right colon. 2 cm low-attenuation area just external to the
abdominal wall muscles is suspicious for a small abscess.

No other acute findings.
